# Patient Record
Sex: Male | Born: 1980 | Race: White | Hispanic: No | Marital: Single | Smoking: Current every day smoker
Health system: Southern US, Community
[De-identification: ages and names within clinical notes are randomized; demographics above are authoritative.]

## PROBLEM LIST (undated history)

## (undated) DIAGNOSIS — K219 Gastro-esophageal reflux disease without esophagitis: Secondary | ICD-10-CM

## (undated) DIAGNOSIS — B019 Varicella without complication: Secondary | ICD-10-CM

## (undated) HISTORY — DX: Varicella without complication: B01.9

## (undated) HISTORY — DX: Gastro-esophageal reflux disease without esophagitis: K21.9

## (undated) HISTORY — PX: HAND SURGERY: SHX662

---

## 2001-04-17 ENCOUNTER — Ambulatory Visit (HOSPITAL_BASED_OUTPATIENT_CLINIC_OR_DEPARTMENT_OTHER): Admission: RE | Admit: 2001-04-17 | Discharge: 2001-04-17 | Payer: Self-pay | Admitting: Orthopedic Surgery

## 2001-09-26 ENCOUNTER — Emergency Department (HOSPITAL_COMMUNITY): Admission: EM | Admit: 2001-09-26 | Discharge: 2001-09-26 | Payer: Self-pay

## 2001-10-07 ENCOUNTER — Emergency Department (HOSPITAL_COMMUNITY): Admission: EM | Admit: 2001-10-07 | Discharge: 2001-10-07 | Payer: Self-pay | Admitting: Emergency Medicine

## 2002-10-08 ENCOUNTER — Encounter: Payer: Self-pay | Admitting: Family Medicine

## 2002-10-08 ENCOUNTER — Encounter: Admission: RE | Admit: 2002-10-08 | Discharge: 2002-10-08 | Payer: Self-pay | Admitting: Family Medicine

## 2004-02-01 ENCOUNTER — Emergency Department (HOSPITAL_COMMUNITY): Admission: EM | Admit: 2004-02-01 | Discharge: 2004-02-01 | Payer: Self-pay | Admitting: Emergency Medicine

## 2005-03-27 ENCOUNTER — Emergency Department (HOSPITAL_COMMUNITY): Admission: EM | Admit: 2005-03-27 | Discharge: 2005-03-27 | Payer: Self-pay | Admitting: Emergency Medicine

## 2005-03-27 ENCOUNTER — Ambulatory Visit (HOSPITAL_COMMUNITY): Payer: Self-pay | Admitting: Psychiatry

## 2005-03-28 ENCOUNTER — Inpatient Hospital Stay (HOSPITAL_COMMUNITY): Admission: RE | Admit: 2005-03-28 | Discharge: 2005-03-29 | Payer: Self-pay | Admitting: Psychiatry

## 2005-03-28 ENCOUNTER — Ambulatory Visit: Payer: Self-pay | Admitting: Psychiatry

## 2005-04-10 ENCOUNTER — Ambulatory Visit (HOSPITAL_COMMUNITY): Payer: Self-pay | Admitting: Psychiatry

## 2005-05-17 ENCOUNTER — Ambulatory Visit: Payer: Self-pay | Admitting: Psychiatry

## 2005-05-17 ENCOUNTER — Ambulatory Visit (HOSPITAL_COMMUNITY): Payer: Self-pay | Admitting: Psychiatry

## 2005-06-26 ENCOUNTER — Ambulatory Visit (HOSPITAL_COMMUNITY): Payer: Self-pay | Admitting: Psychiatry

## 2005-07-03 ENCOUNTER — Ambulatory Visit (HOSPITAL_COMMUNITY): Payer: Self-pay | Admitting: Psychiatry

## 2005-07-22 IMAGING — CR DG FINGER MIDDLE 2+V*R*
1 series · 1 of 1 positions shown · non-contrast
Comparison: none

CLINICAL DATA: Smashed right middle finger in door.  Pain and swelling.
 RIGHT MIDDLE FINGER, THREE VIEWS 
 Soft tissue swelling is seen overlying the distal phalanx.  There is a nondisplaced fracture from the radial aspect of the tuft of the distal phalanx.  No other fracture is seen.  Alignment of the bones is normal. 
 IMPRESSION
 Nondisplaced fracture from the radial aspect of the tuft of the distal phalanx.

[view not recorded]
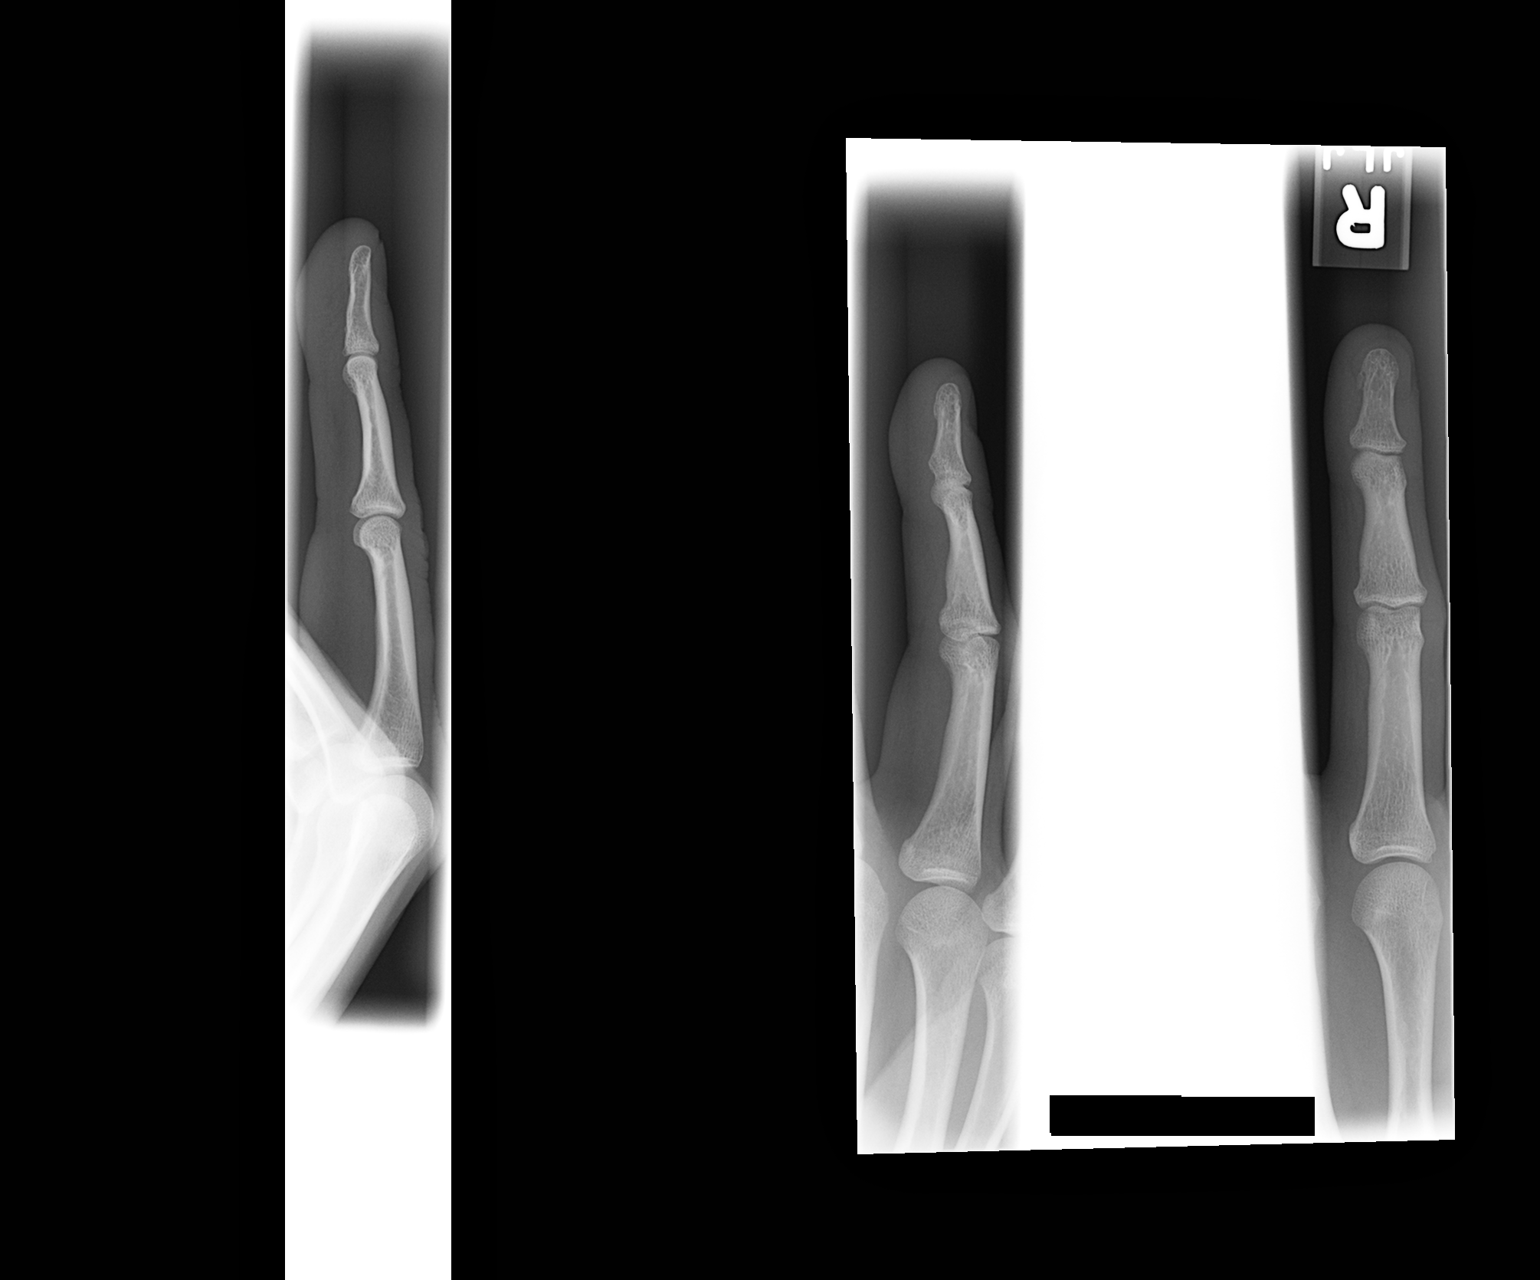

[1 of 1 positions shown; findings below may reference images not displayed]

## 2005-08-07 ENCOUNTER — Ambulatory Visit (HOSPITAL_COMMUNITY): Payer: Self-pay | Admitting: Psychiatry

## 2005-09-11 ENCOUNTER — Ambulatory Visit (HOSPITAL_COMMUNITY): Payer: Self-pay | Admitting: Psychiatry

## 2005-09-23 ENCOUNTER — Emergency Department (HOSPITAL_COMMUNITY): Admission: EM | Admit: 2005-09-23 | Discharge: 2005-09-23 | Payer: Self-pay | Admitting: Emergency Medicine

## 2005-09-24 ENCOUNTER — Emergency Department (HOSPITAL_COMMUNITY): Admission: EM | Admit: 2005-09-24 | Discharge: 2005-09-24 | Payer: Self-pay | Admitting: Emergency Medicine

## 2014-08-28 ENCOUNTER — Encounter: Payer: Self-pay | Admitting: Podiatrist

## 2014-08-28 ENCOUNTER — Ambulatory Visit (INDEPENDENT_AMBULATORY_CARE_PROVIDER_SITE_OTHER): Payer: Managed Care, Other (non HMO) | Admitting: Podiatrist

## 2014-08-28 VITALS — BP 124/85 | HR 66 | Resp 16 | Ht 72.0 in | Wt 175.0 lb

## 2014-08-28 DIAGNOSIS — L03039 Cellulitis of unspecified toe: Secondary | ICD-10-CM

## 2014-08-28 MED ORDER — CEPHALEXIN 500 MG PO CAPS: 500.0000 mg | ORAL_CAPSULE | Freq: Three times a day (TID) | ORAL | Status: DC

## 2014-08-28 NOTE — Patient Instructions (Signed)

## 2014-08-28 NOTE — Progress Notes (Signed)
   Subjective:    Patient ID: Tommy Ortega, male    DOB: 1981/04/27, 33 y.o.   MRN: [id number]  HPI Comments: Pt states he removed part of the left 1st toenail about 2 - 3 weeks ago, due to pain in the area.  Pt now has redness, swelling and serosanguinous fluid from the left 1st toenail area.  Pt has been treating with epsom salt soaks, H2O2 and home surgery.      Review of Systems  All other systems reviewed and are negative.      Objective:   Physical Exam  Patient is awake, alert, and oriented x 3.  In no acute distress.  Vascular status is intact with palpable pedal pulses at 2/4 DP and PT bilateral and capillary refill time within normal limits. Neurological sensation is also intact bilaterally via Semmes Weinstein monofilament at 5/5 sites. Light touch, vibratory sensation, Achilles tendon reflex is intact. Dermatological exam reveals skin color, turger and texture as normal. No open lesions present.  Musculature intact with dorsiflexion, plantarflexion, inversion, eversion. Left hallux nail medial nail border and appears to be red, swollen, irritated ingrown. No active drainage or pus is seen. Pain with pressure is noted.    Assessment & Plan:  Paronychia left hallux medial nail border  Name: Recommended an incision and drainage and patient agreed. The toe was anesthetized and then prepped with Betadine and exsanguinated. The medial nail border was then excised and the area was cleansed well with hydrogen peroxide and Betadine solution. Antibiotic ointment and a dressing was applied. The patient was given instructions for aftercare and a prescription for Keflex antibiotics was written. He will be seen back for followup as necessary

## 2014-12-14 ENCOUNTER — Ambulatory Visit (INDEPENDENT_AMBULATORY_CARE_PROVIDER_SITE_OTHER): Payer: Managed Care, Other (non HMO)

## 2014-12-15 ENCOUNTER — Encounter: Payer: Self-pay | Admitting: Emergency Medicine

## 2014-12-15 ENCOUNTER — Ambulatory Visit (INDEPENDENT_AMBULATORY_CARE_PROVIDER_SITE_OTHER): Payer: Managed Care, Other (non HMO) | Admitting: Emergency Medicine

## 2014-12-15 VITALS — BP 119/75 | HR 85 | Temp 98.2°F | Resp 16 | Ht 70.5 in | Wt 175.0 lb

## 2014-12-15 DIAGNOSIS — F1921 Other psychoactive substance dependence, in remission: Secondary | ICD-10-CM

## 2014-12-15 MED ORDER — CLONIDINE HCL 0.1 MG PO TABS: 0.1000 mg | ORAL_TABLET | Freq: Three times a day (TID) | ORAL | Status: DC

## 2014-12-15 MED ORDER — BACLOFEN 10 MG PO TABS: ORAL_TABLET | ORAL | Status: DC

## 2014-12-15 NOTE — Progress Notes (Signed)
Subjective:    Patient ID: Tommy Ortega, male    DOB: 07-23-1981, 33 y.o.   MRN: [id number] This chart was scribed for Lesle ChrisSteven Lenette Rau, MD by Littie Deedsichard Sun, Medical Scribe. This patient was seen in room 21 and the patient's care was started at 11:59 AM.   HPI HPI Comments: Tommy Ortega is a 33 y.o. male who presents to the [facility] complaining of insomnia due to withdrawal. He also reports having a sore throat.  Patient reports that he has been using substances including alcohol, marijuana and cocaine since the age of [age]. He states he did not have any trouble with substances until he started on oxycodone in his 5020s. He soon switched to taking heroin because oxycodone was hard to obtain. His heroin use was heavy, but he states he also snorted it and never injected it. Around 2003-2004, patient [facility] to [facility]. He was given medications for detox at that time, which was the only time he was put on medications for detox. Patient states that [facility] did not help and he was also kicked out because of drug use during the program. He also tried a methadone clinic, but that was not very helpful. Patient started smoking crack cocaine because he had heard that it was a way to get off heroin; however, he went on a 4 month crack binge after he started it. Patient started a program with [facility] [place] in 2007 and graduated in 2008. He was clean after that time until 2013 when some family issues occurred. His grandmother was hospitalized for DM complications, and his grandpa developed a MI at the time while trying to help the grandmother. His grandmother ended up living, but his grandfather passed away. Patient then became addicted to narcotics again and took some of his grandmother's medication. He has been to the [facility] recently to try to detox, but he has had some difficulty with the practice. The patient  has been seen by several different doctors who have been telling him different things. He last went 2 weeks ago, but he could not get a prescription for Suboxone due to paperwork issues not due to the fault of the patient. His last dose of Suboxone was half of a 8mg  on 12/14 and has been going through withdrawal symptoms since. These symptoms include insomnia, palpitations at night, chills, subjective fever, loss of appetite, diarrhea, myalgias especially in his arms and flu symptoms.  Patient has a 76109-year-old boy and is not together with the mother. He is not currently going to [facility]. He has been taking multivitamins and ginseng to boost his energy levels.   Review of Systems  Constitutional: Positive for fever, chills and appetite change.  HENT: Positive for sore throat.   Cardiovascular: Positive for palpitations.  Gastrointestinal: Positive for diarrhea.  Musculoskeletal: Positive for myalgias.  Psychiatric/Behavioral: Positive for sleep disturbance.       Objective:   Physical Exam CONSTITUTIONAL: Well developed/well nourished HEAD: Normocephalic/atraumatic EYES: EOM/PERRL ENMT: Mucous membranes moist NECK: supple no meningeal signs SPINE: entire spine nontender CV: S1/S2 noted, no murmurs/rubs/gallops noted LUNGS: Lungs are clear to auscultation bilaterally, no apparent distress ABDOMEN: soft, nontender, no rebound or guarding GU: no cva tenderness NEURO: Pt is awake/alert, moves all extremitiesx4 EXTREMITIES: pulses normal, full ROM SKIN: warm, color normal PSYCH: no abnormalities of mood noted        Assessment &  Plan:  Patient about 2 weeks out of stopping his Suboxone. He still has withdrawal symptoms primarily loose stools muscle cramping and a sensation of globus in his throat. Treat patient with Catapres 0.1  3 times a day along with baclofen 10 mg 1/2-1 at bedtime. He can continue Benadryl and ibuprofen. Urine drug screen in our office was negative. We'll recheck in 10  days.I personally performed the services described in this documentation, which was scribed in my presence. The recorded information has been reviewed and is accurate.

## 2014-12-24 ENCOUNTER — Encounter: Payer: Self-pay | Admitting: Emergency Medicine

## 2014-12-24 ENCOUNTER — Ambulatory Visit (INDEPENDENT_AMBULATORY_CARE_PROVIDER_SITE_OTHER): Payer: Managed Care, Other (non HMO)

## 2014-12-24 ENCOUNTER — Ambulatory Visit (INDEPENDENT_AMBULATORY_CARE_PROVIDER_SITE_OTHER): Payer: Managed Care, Other (non HMO) | Admitting: Emergency Medicine

## 2014-12-24 VITALS — BP 106/60 | HR 70 | Temp 98.0°F | Resp 16 | Ht 71.0 in | Wt 179.8 lb

## 2014-12-24 DIAGNOSIS — R0989 Other specified symptoms and signs involving the circulatory and respiratory systems: Secondary | ICD-10-CM

## 2014-12-24 DIAGNOSIS — F458 Other somatoform disorders: Secondary | ICD-10-CM

## 2014-12-24 DIAGNOSIS — R070 Pain in throat: Secondary | ICD-10-CM

## 2014-12-24 DIAGNOSIS — R252 Cramp and spasm: Secondary | ICD-10-CM

## 2014-12-24 LAB — TSH: TSH: 1.329 u[IU]/mL (ref 0.350–4.500)

## 2014-12-24 LAB — CBC WITH DIFFERENTIAL/PLATELET
Basophils Absolute: 0.1 10*3/uL (ref 0.0–0.1)
Basophils Relative: 1 % (ref 0–1)
Eosinophils Absolute: 0.3 10*3/uL (ref 0.0–0.7)
Eosinophils Relative: 3 % (ref 0–5)
HCT: 43.1 % (ref 39.0–52.0)
Hemoglobin: 14.7 g/dL (ref 13.0–17.0)
Lymphocytes Relative: 23 % (ref 12–46)
Lymphs Abs: 2.2 10*3/uL (ref 0.7–4.0)
MCH: 28.8 pg (ref 26.0–34.0)
MCHC: 34.1 g/dL (ref 30.0–36.0)
MCV: 84.3 fL (ref 78.0–100.0)
MPV: 11.2 fL (ref 8.6–12.4)
Monocytes Absolute: 0.7 10*3/uL (ref 0.1–1.0)
Monocytes Relative: 7 % (ref 3–12)
Neutro Abs: 6.2 10*3/uL (ref 1.7–7.7)
Neutrophils Relative %: 66 % (ref 43–77)
Platelets: 237 10*3/uL (ref 150–400)
RBC: 5.11 MIL/uL (ref 4.22–5.81)
RDW: 14 % (ref 11.5–15.5)
WBC: 9.4 10*3/uL (ref 4.0–10.5)

## 2014-12-24 LAB — T4: T4, Total: 6.7 ug/dL (ref 4.5–12.0)

## 2014-12-24 NOTE — Progress Notes (Addendum)
   Subjective:  This chart was scribed for Tommy GobbleSteven A Donis Kotowski, MD by Tommy Ortega, ED Scribe. The patient was seen in room 22. Patient's care was started at 9:59 AM.  Patient ID: Tommy Ortega, male    DOB: 05-Oct-1981, 34 y.o.   MRN: [id number]  HPI  HPI Comments: Tommy Ortega is a 34 y.o. male who presents to the [facility] for follow up with drug addition to Suboxone. He reports intermittent difficulty sleeping. He states that his prescribed Clonidine has helped with this problem, but not relieved it entirely. He states that he does not take it during the day because it reduces his energy at work. He is reporting a discomfort in his throat that feels like something is stuck there, and he states that he noticed a "white spot" that went away. He denies difficulty swallowing or eating food. He additionally reports left flank pain, and expresses concern of a kidney infection. He states that he has not smoked a cigarette in a long time. He states that he has used dip for 3-4 years, and he used a tobacco vape recently.    Review of Systems  Constitutional: Negative for fever and chills.  HENT: Negative for congestion, ear pain, rhinorrhea and sinus pressure.   Respiratory: Negative for chest tightness, shortness of breath and wheezing.   Cardiovascular: Negative for chest pain, palpitations and leg swelling.  Gastrointestinal: Negative for nausea, vomiting, abdominal pain, diarrhea, constipation and abdominal distention.  Genitourinary: Positive for flank pain. Negative for dysuria, hematuria and difficulty urinating.  Musculoskeletal: Negative for back pain, arthralgias and neck pain.  Skin: Negative for rash and wound.  Neurological: Negative for dizziness, weakness, numbness and headaches.  Psychiatric/Behavioral: Positive for sleep disturbance.    Objective:  Physical Exam  CONSTITUTIONAL: Well developed/well nourished HEAD: Normocephalic/atraumatic EYES:  EOMI/PERRL ENMT: Mucous membranes moist, normal digital exam NECK: supple no meningeal signs, normal thyroid SPINE/BACK:entire spine nontender CV: S1/S2 noted, no murmurs/rubs/gallops noted LUNGS: Lungs are clear to auscultation bilaterally, no apparent distress ABDOMEN: soft, nontender, no rebound or guarding, bowel sounds noted throughout abdomen GU:no cva tenderness NEURO: Pt is awake/alert/appropriate, moves all extremitiesx4.  No facial droop.   EXTREMITIES: pulses normal/equal, full ROM SKIN: warm, color normal PSYCH: no abnormalities of mood noted, alert and oriented to situation [facility] reading (PRIMARY) by  Dr. Cleta Albertsaub no acute disease on chest x-ray. Lateral neck please comment on the area posterior to the base of the tongue. no other definite abnormalities.   Assessment & Plan:  We'll ask for a reading from radiology regarding the globus sensation in his throat and neck. Patient seems to be doing well. We'll go ahead and CT the neck and get an opinion from ENT regarding the sensation he has of something in his throat.I personally performed the services described in this documentation, which was scribed in my presence. The recorded information has been reviewed and is accurate. The radiologist did not see any abnormalities on his x-rays and recommended a CT.

## 2014-12-31 ENCOUNTER — Other Ambulatory Visit: Payer: Self-pay

## 2016-06-13 IMAGING — CR DG NECK SOFT TISSUE
1 series · 1 of 1 positions shown · non-contrast
Comparison: None.

CLINICAL DATA: Sensation of something stuck in throat.

EXAM:
NECK SOFT TISSUES - 1+ VIEW

[lateral]
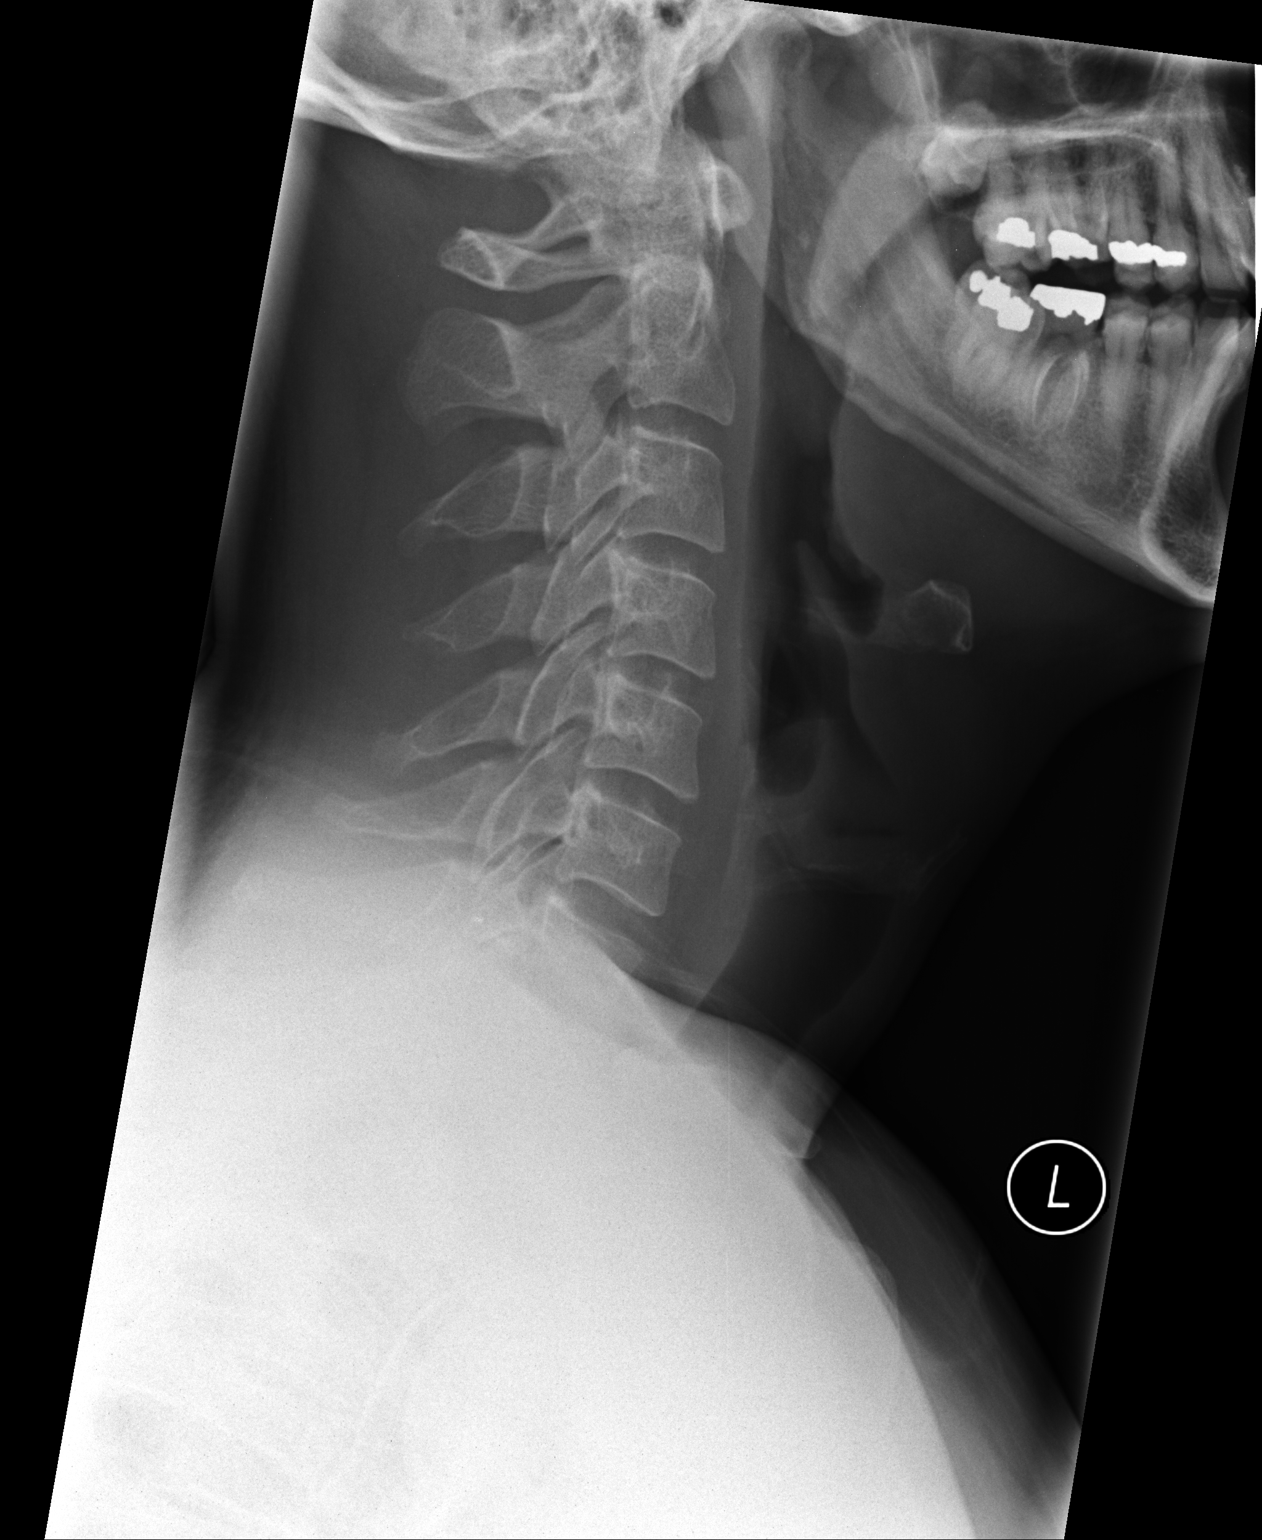

[1 of 1 positions shown; findings below may reference images not displayed]

FINDINGS: Soft tissue structures and bony structures are normal. Cervical
airway is widely patent. Epiglottis is normal. Oropharynx region
appears normal. Tongue base appears unremarkable.
IMPRESSION: Negative exam.  If symptoms persist CT of the neck can be obtained.

## 2019-01-10 ENCOUNTER — Encounter: Payer: Self-pay | Admitting: Family Medicine

## 2019-01-10 ENCOUNTER — Ambulatory Visit (INDEPENDENT_AMBULATORY_CARE_PROVIDER_SITE_OTHER): Payer: 59 | Admitting: Family Medicine

## 2019-01-10 VITALS — BP 142/70 | Ht 71.0 in | Wt 171.5 lb

## 2019-01-10 DIAGNOSIS — F1911 Other psychoactive substance abuse, in remission: Secondary | ICD-10-CM | POA: Diagnosis not present

## 2019-01-10 DIAGNOSIS — K589 Irritable bowel syndrome without diarrhea: Secondary | ICD-10-CM

## 2019-01-10 DIAGNOSIS — J40 Bronchitis, not specified as acute or chronic: Secondary | ICD-10-CM | POA: Diagnosis not present

## 2019-01-10 DIAGNOSIS — K089 Disorder of teeth and supporting structures, unspecified: Secondary | ICD-10-CM | POA: Diagnosis not present

## 2019-01-10 DIAGNOSIS — Z0000 Encounter for general adult medical examination without abnormal findings: Secondary | ICD-10-CM | POA: Diagnosis not present

## 2019-01-10 DIAGNOSIS — H6122 Impacted cerumen, left ear: Secondary | ICD-10-CM | POA: Insufficient documentation

## 2019-01-10 LAB — CBC
HEMATOCRIT: 44.8 % (ref 39.0–52.0)
Hemoglobin: 15.1 g/dL (ref 13.0–17.0)
MCHC: 33.7 g/dL (ref 30.0–36.0)
MCV: 88.3 fl (ref 78.0–100.0)
Platelets: 222 10*3/uL (ref 150.0–400.0)
RBC: 5.07 Mil/uL (ref 4.22–5.81)
RDW: 13 % (ref 11.5–15.5)
WBC: 8.9 10*3/uL (ref 4.0–10.5)

## 2019-01-10 LAB — COMPREHENSIVE METABOLIC PANEL
ALT: 26 U/L (ref 0–53)
AST: 22 U/L (ref 0–37)
Albumin: 4.8 g/dL (ref 3.5–5.2)
Alkaline Phosphatase: 63 U/L (ref 39–117)
BILIRUBIN TOTAL: 0.6 mg/dL (ref 0.2–1.2)
BUN: 11 mg/dL (ref 6–23)
CALCIUM: 9.9 mg/dL (ref 8.4–10.5)
CHLORIDE: 103 meq/L (ref 96–112)
CO2: 28 mEq/L (ref 19–32)
CREATININE: 1.05 mg/dL (ref 0.40–1.50)
GFR: 78.82 mL/min (ref >60.00)
Glucose, Bld: 84 mg/dL (ref 70–99)
Potassium: 4.1 mEq/L (ref 3.5–5.1)
Sodium: 138 mEq/L (ref 135–145)
Total Protein: 7.7 g/dL (ref 6.0–8.3)

## 2019-01-10 LAB — URINALYSIS, ROUTINE W REFLEX MICROSCOPIC
Bilirubin Urine: NEGATIVE
Hgb urine dipstick: NEGATIVE
Ketones, ur: NEGATIVE
Leukocytes, UA: NEGATIVE
Nitrite: NEGATIVE
Specific Gravity, Urine: <=1.005 — AB (ref 1.000–1.030)
Total Protein, Urine: NEGATIVE
Urine Glucose: NEGATIVE
Urobilinogen, UA: 0.2 (ref 0.0–1.0)
pH: 6 (ref 5.0–8.0)

## 2019-01-10 LAB — LIPID PANEL
CHOLESTEROL: 190 mg/dL (ref 0–200)
HDL: 42.3 mg/dL (ref >39.00)
LDL Cholesterol: 118 mg/dL — ABNORMAL HIGH (ref 0–99)
NonHDL: 147.53
TRIGLYCERIDES: 149 mg/dL (ref 0.0–149.0)
Total CHOL/HDL Ratio: 4
VLDL: 29.8 mg/dL (ref 0.0–40.0)

## 2019-01-10 LAB — TSH: TSH: 0.69 u[IU]/mL (ref 0.35–4.50)

## 2019-01-10 MED ORDER — AMOXICILLIN 500 MG PO CAPS: 500.0000 mg | ORAL_CAPSULE | Freq: Three times a day (TID) | ORAL | 0 refills | Status: DC

## 2019-01-10 NOTE — Patient Instructions (Signed)
Earwax Buildup, Adult The ears produce a substance called earwax that helps keep bacteria out of the ear and protects the skin in the ear canal. Occasionally, earwax can build up in the ear and cause discomfort or hearing loss. What increases the risk? This condition is more likely to develop in people who:  Are male.  Are elderly.  Naturally produce more earwax.  Clean their ears often with cotton swabs.  Use earplugs often.  Use in-ear headphones often.  Wear hearing aids.  Have narrow ear canals.  Have earwax that is overly thick or sticky.  Have eczema.  Are dehydrated.  Have excess hair in the ear canal. What are the signs or symptoms? Symptoms of this condition include:  Reduced or muffled hearing.  A feeling of fullness in the ear or feeling that the ear is plugged.  Fluid coming from the ear.  Ear pain.  Ear itch.  Ringing in the ear.  Coughing.  An obvious piece of earwax that can be seen inside the ear canal. How is this diagnosed? This condition may be diagnosed based on:  Your symptoms.  Your medical history.  An ear exam. During the exam, your health care provider will look into your ear with an instrument called an otoscope. You may have tests, including a hearing test. How is this treated? This condition may be treated by:  Using ear drops to soften the earwax.  Having the earwax removed by a health care provider. The health care provider may: ? Flush the ear with water. ? Use an instrument that has a loop on the end (curette). ? Use a suction device.  Surgery to remove the wax buildup. This may be done in severe cases. Follow these instructions at home:   Take over-the-counter and prescription medicines only as told by your health care provider.  Do not put any objects, including cotton swabs, into your ear. You can clean the opening of your ear canal with a washcloth or facial tissue.  Follow instructions from your health care  provider about cleaning your ears. Do not over-clean your ears.  Drink enough fluid to keep your urine clear or pale yellow. This will help to thin the earwax.  Keep all follow-up visits as told by your health care provider. If earwax builds up in your ears often or if you use hearing aids, consider seeing your health care provider for routine, preventive ear cleanings. Ask your health care provider how often you should schedule your cleanings.  If you have hearing aids, clean them according to instructions from the manufacturer and your health care provider. Contact a health care provider if:  You have ear pain.  You develop a fever.  You have blood, pus, or other fluid coming from your ear.  You have hearing loss.  You have ringing in your ears that does not go away.  Your symptoms do not improve with treatment.  You feel like the room is spinning (vertigo). Summary  Earwax can build up in the ear and cause discomfort or hearing loss.  The most common symptoms of this condition include reduced or muffled hearing and a feeling of fullness in the ear or feeling that the ear is plugged.  This condition may be diagnosed based on your symptoms, your medical history, and an ear exam.  This condition may be treated by using ear drops to soften the earwax or by having the earwax removed by a health care provider.  Do not put any   objects, including cotton swabs, into your ear. You can clean the opening of your ear canal with a washcloth or facial tissue. This information is not intended to replace advice given to you by your health care provider. Make sure you discuss any questions you have with your health care provider. Document Released: 01/11/2005 Document Revised: 11/15/2017 Document Reviewed: 02/14/2017 Elsevier Interactive Patient Education  2019 [employer]  Chemical Dependency Chemical dependency is an addiction to drugs or alcohol. People with this addiction repeatedly  seek out and use drugs or alcohol despite negative consequences to the health and safety of themselves and others. Addiction changes the way the brain works. Because of these changes, addiction is a chronic condition. The medical term for addiction or chemical dependency is substance use disorder. The disorder can be mild, moderate, or severe. People can be dependent on a range of substances. These include alcohol, prescription medicines, and illegal or street drugs, such as marijuana, heroin, and cocaine. What are the causes? This condition is caused by the effect that the abused substance has on the brain. What increases the risk? The following factors may make you more likely to develop this condition:  Havinga family history of chemical dependency.  Having mental health issues, such as depression, anxiety, or bipolar disorder.  Living in an environment where drugs and alcohol are easily available.  Using drugs or alcohol at a young age.  Having friends who use drugs or alcohol.  Having poor social skills.  Tending to be aggressive or impulsive. What are the signs or symptoms? Symptoms may vary depending on the substance that you are addicted to. Symptoms may include the following: Physical Symptoms  The inability to limit the use of drugs or alcohol.  Having nausea, sweating, shakiness, and anxiety when you are not using alcohol or drugs.  Needing a greater amount of drugs or alcohol to get the same effect (developing tolerance).  A change in: ? Sleeping habits. ? Eating or appetite. ? Appearance or how you care for yourself. Emotional Symptoms  Angry outbursts.  Periods of sadness and tearfulness.  Isolation. Relationship Problems  Loved ones suggesting that you have a problem.  Increased fights.  Forgetting commitments.  Having affairs or one-night stands. Problems Related to Irresponsibility  Legal problems.  Irresponsibility with money.  Missing  work.  Poor decision making. How is this diagnosed? This condition may be diagnosed based on your symptoms, your medical history, and a physical exam. You may also have blood tests and urine tests. How is this treated? Treatment for this condition depends on the substance that you are addicted to and whether your dependency is mild, moderate, or severe. Treatment options may include:  Stopping substance use safely. This may require taking medicines and being closely observed for several days.  Taking part in group and individual counseling with mental health providers who help people with chemical dependency.  Staying at a residential treatment center for several days or weeks.  Attending daily counseling sessions at a treatment center.  Taking medicine as told by your health care provider to: ? Ease symptoms and prevent complications during withdrawal. ? Treat other mental health issues, such as depression or anxiety. ? Block cravings by causing the same effects as the substance. ? Block the effects of the substance or replace good sensations with unpleasant ones.  Going to a support group to share your experience with others who are going through the same thing. These groups are an important part of long-term recovery  for many people. They include 12-step programs like Alcoholics Anonymous and Narcotics Anonymous.  Recovery can be a long process. Many people who undergo treatment start using the substance again after stopping. This is called a relapse. If you have a relapse, it does not mean that treatment will not work. Follow these instructions at home:  Avoid temptations or triggers that you associate with your use of the substance.  Learn and practice techniques for managing stress.  Have a plan for vulnerable moments.  Get phone numbers of those who are willing to help and who are committed to your recovery.  Know when and where the meetings that you have chosen will  occur.  Take over-the-counter and prescription medicines only as told by your health care provider.  Keep all follow-up visits as told by your health care provider. This is important. Contact a health care provider if:  You cannot take your medicines as told.  Your symptoms get worse.  You have trouble resisting the urge to use drugs or alcohol.  You are in pain, shaking, sweating, or feeling generally unwell.  You are losing weight without trying to. Get help right away if:  You lose consciousness.  Your breathing is slow.  Your pulse is slow or jumpy.  You have serious thoughts about hurting yourself or someone else.  You have a relapse. This information is not intended to replace advice given to you by your health care provider. Make sure you discuss any questions you have with your health care provider. Document Released: 11/28/2001 Document Revised: 01/10/2016 Document Reviewed: 08/11/2015 Elsevier Interactive Patient Education  2019 [employer].  Health Maintenance, Male A healthy lifestyle and preventive care is important for your health and wellness. Ask your health care provider about what schedule of regular examinations is right for you. What should I know about weight and diet? Eat a Healthy Diet  Eat plenty of vegetables, fruits, whole grains, low-fat dairy products, and lean protein.  Do not eat a lot of foods high in solid fats, added sugars, or salt.  Maintain a Healthy Weight Regular exercise can help you achieve or maintain a healthy weight. You should:  Do at least 150 minutes of exercise each week. The exercise should increase your heart rate and make you sweat (moderate-intensity exercise).  Do strength-training exercises at least twice a week. Watch Your Levels of Cholesterol and Blood Lipids  Have your blood tested for lipids and cholesterol every 5 years starting at 39 years of age. If you are at high risk for heart disease, you should start  having your blood tested when you are 39 years old. You may need to have your cholesterol levels checked more often if: ? Your lipid or cholesterol levels are high. ? You are older than 39 years of age. ? You are at high risk for heart disease. What should I know about cancer screening? Many types of cancers can be detected early and may often be prevented. Lung Cancer  You should be screened every year for lung cancer if: ? You are a current smoker who has smoked for at least 30 years. ? You are a former smoker who has quit within the past 15 years.  Talk to your health care provider about your screening options, when you should start screening, and how often you should be screened. Colorectal Cancer  Routine colorectal cancer screening usually begins at 39 years of age and should be repeated every 5-10 years until you are 39 years  old. Bonita QuinYou may need to be screened more often if early forms of precancerous polyps or small growths are found. Your health care provider may recommend screening at an earlier age if you have risk factors for colon cancer.  Your health care provider may recommend using home test kits to check for hidden blood in the stool.  A small camera at the end of a tube can be used to examine your colon (sigmoidoscopy or colonoscopy). This checks for the earliest forms of colorectal cancer. Prostate and Testicular Cancer  Depending on your age and overall health, your health care provider may do certain tests to screen for prostate and testicular cancer.  Talk to your health care provider about any symptoms or concerns you have about testicular or prostate cancer. Skin Cancer  Check your skin from head to toe regularly.  Tell your health care provider about any new moles or changes in moles, especially if: ? There is a change in a mole's size, shape, or color. ? You have a mole that is larger than a pencil eraser.  Always use sunscreen. Apply sunscreen liberally and  repeat throughout the day.  Protect yourself by wearing long sleeves, pants, a wide-brimmed hat, and sunglasses when outside. What should I know about heart disease, diabetes, and high blood pressure?  If you are 5218-39 years of age, have your blood pressure checked every 3-5 years. If you are 39 years of age or older, have your blood pressure checked every year. You should have your blood pressure measured twice-once when you are at a hospital or clinic, and once when you are not at a hospital or clinic. Record the average of the two measurements. To check your blood pressure when you are not at a hospital or clinic, you can use: ? An automated blood pressure machine at a pharmacy. ? A home blood pressure monitor.  Talk to your health care provider about your target blood pressure.  If you are between 5645-39 years old, ask your health care provider if you should take aspirin to prevent heart disease.  Have regular diabetes screenings by checking your fasting blood sugar level. ? If you are at a normal weight and have a low risk for diabetes, have this test once every three years after the age of 39. ? If you are overweight and have a high risk for diabetes, consider being tested at a younger age or more often.  A one-time screening for abdominal aortic aneurysm (AAA) by ultrasound is recommended for men aged 65-75 years who are current or former smokers. What should I know about preventing infection? Hepatitis B If you have a higher risk for hepatitis B, you should be screened for this virus. Talk with your health care provider to find out if you are at risk for hepatitis B infection. Hepatitis C Blood testing is recommended for:  Everyone born from [id number] through 1966.  Anyone with known risk factors for hepatitis C. Sexually Transmitted Diseases (STDs)  You should be screened each year for STDs including gonorrhea and chlamydia if: ? You are sexually active and are younger than 39 years  of age. ? You are older than 39 years of age and your health care provider tells you that you are at risk for this type of infection. ? Your sexual activity has changed since you were last screened and you are at an increased risk for chlamydia or gonorrhea. Ask your health care provider if you are at risk.  Talk with  your health care provider about whether you are at high risk of being infected with HIV. Your health care provider may recommend a prescription medicine to help prevent HIV infection. What else can I do?  Schedule regular health, dental, and eye exams.  Stay current with your vaccines (immunizations).  Do not use any tobacco products, such as cigarettes, chewing tobacco, and e-cigarettes. If you need help quitting, ask your health care provider.  Limit alcohol intake to no more than 2 drinks per day. One drink equals 12 ounces of beer, 5 ounces of wine, or 1 ounces of hard liquor.  Do not use street drugs.  Do not share needles.  Ask your health care provider for help if you need support or information about quitting drugs.  Tell your health care provider if you often feel depressed.  Tell your health care provider if you have ever been abused or do not feel safe at home. This information is not intended to replace advice given to you by your health care provider. Make sure you discuss any questions you have with your health care provider. Document Released: 06/01/2008 Document Revised: 08/02/2016 Document Reviewed: 09/07/2015 Elsevier Interactive Patient Education  2019 [employer].  Substance Use Disorder Substance use disorder occurs when a person's repeated use of drugs or alcohol interferes with his or her ability to be productive. This disorder can cause problems with mental and physical health. It can affect your ability to have healthy relationships, and it can keep you from being able to meet your responsibilities at work, home, or school. It can also lead to  addiction, which is a condition in which the person cannot stop using the substance consistently for a period of time. The most commonly abused substances include:  Alcohol.  Tobacco.  Marijuana.  Stimulants, such as cocaine and methamphetamine.  Hallucinogens, such as LSD and PCP.  Opioids, such as some prescription pain medicines and heroin. What are the causes? This condition may develop due to many complex social, psychological, or physical reasons, such as:  Stress.  Abuse.  Peer pressure.  Anxiety or depression. What increases the risk? This condition is more likely to develop in people who:  Use substances to cope with stress.  Have been abused.  Have a mental health disorder, such as depression.  Have a family history of substance use disorder. What are the signs or symptoms? Symptoms of this condition include:  Using the substance for longer periods of time or at a higher dosage than what is normal or intended.  Having a lasting desire to use the substance.  Being unable to slow down or stop your use of the substance.  Spending an abnormal amount of time getting the substance, using the substance, or recovering from using the substance.  Craving the substance.  Using the substance in a way that interferes with work, school, social activities, and personal relationships.  Using the substance even after having negative consequences, such as: ? Health problems. ? Legal or financial troubles. ? Job loss. ? Broken relationships.  Needing more and more of the substance to get the same effect (developing tolerance).  Experiencing unpleasant symptoms if you do not use the substance (withdrawal).  Using the substance to avoid withdrawal. How is this diagnosed? This condition may be diagnosed based on:  A physical exam.  Your history of substance use.  Your symptoms. This includes: ? How substance use affects your life. ? Changes in personality,  behaviors, and mood. ? Having at  least two symptoms of substance use disorder within a 70-month period. ? Health issues related to substance use, such as liver damage, shortness of breath, fatigue, cough, or heart problems.  Blood or urine tests to screen for alcohol and drugs. How is this treated? This condition may be treated by:  Stopping substance use safely. This may require taking medicines and being closely observed for several days.  Taking part in group and individual counseling from mental health providers who help people with substance use disorder.  Staying at a live-in (residential) treatment center for several days or weeks.  Attending daily counseling sessions at a treatment center.  Taking medicine as told by your health care provider: ? To ease symptoms and prevent complications during withdrawal. ? To treat other mental health issues, such as depression or anxiety. ? To block cravings by causing the same effects as the substance. ? To block the effects of the substance or replace good sensations with unpleasant ones.  Going to a support group to share your experience with others who are going through the same thing. Recovery can be a long process. Many people who undergo treatment start using the substance again after stopping (relapse). If you relapse, that does not mean that treatment will not work. Follow these instructions at home:   Take over-the-counter and prescription medicines only as told by your health care provider.  Do not use any drugs or alcohol.  Attend support groups as needed. These groups, including 12-step programs like Alcoholics Anonymous and Narcotics Anonymous, are an important part of long-term recovery for many people.  Keep all follow-up visits as told by your health care providers. This is important. This includes continuing to work with therapists and support groups. Contact a health care provider if:  You cannot take your medicines  as told.  Your symptoms get worse.  You have trouble resisting the urge to use drugs or alcohol. Get help right away if you:  Relapse.  Think that you may have taken too much of a drug. The hotline of the [facility] is [phone].  Have signs of an overdose. Symptoms include: ? Chest pain. ? Confusion. ? Sleepiness or difficulty staying awake. ? Slowed breathing. ? Nausea or vomiting. ? A seizure.  Have serious thoughts about hurting yourself or someone else. Drug overdose is an emergency. Do not wait to see if the symptoms will go away. Get medical help right away. Call your local emergency services (911 in the [place] Do not drive yourself to the hospital. If you ever feel like you may hurt yourself or others, or have thoughts about taking your own life, get help right away. You can go to your nearest emergency department or call:  Your local emergency services (911 in the [place]  A suicide crisis helpline, such as the National Suicide Prevention Lifeline at 260-401-3540. This is open 24 hours a day. Summary  Substance use disorder occurs when a person's repeated use of drugs or alcohol interferes with his or her ability to be productive. It can affect your ability to have healthy relationships, keep you from being able to meet your responsibilities at work, home, or school, and lead to addiction.  Taking part in group and individual counseling from mental health providers is one possible treatment for people with substance use disorder.  Recovery can be a long process. Many people who undergo treatment start using the substance again after stopping (relapse). A relapse does not mean that treatment will  not work.  Attend support groups as needed, such as Alcoholics Anonymous and Narcotics Anonymous. These groups are an important part of long-term recovery for many people. This information is not intended to replace advice given to you by your health care  provider. Make sure you discuss any questions you have with your health care provider. Document Released: 07/26/2005 Document Revised: 01/15/2018 Document Reviewed: 01/15/2018 Elsevier Interactive Patient Education  2019 [employer] Inc[employer]  Upper Respiratory Infection, Adult An upper respiratory infection (URI) is a common viral infection of the nose, throat, and upper air passages that lead to the lungs. The most common type of URI is the common cold. URIs usually get better on their own, without medical treatment. What are the causes? A URI is caused by a virus. You may catch a virus by:  Breathing in droplets from an infected person's cough or sneeze.  Touching something that has been exposed to the virus (contaminated) and then touching your mouth, nose, or eyes. What increases the risk? You are more likely to get a URI if:  You are very young or very old.  It is autumn or winter.  You have close contact with others, such as at a daycare, school, or health care facility.  You smoke.  You have long-term (chronic) heart or lung disease.  You have a weakened disease-fighting (immune) system.  You have nasal allergies or asthma.  You are experiencing a lot of stress.  You work in an area that has poor air circulation.  You have poor nutrition. What are the signs or symptoms? A URI usually involves some of the following symptoms:  Runny or stuffy (congested) nose.  Sneezing.  Cough.  Sore throat.  Headache.  Fatigue.  Fever.  Loss of appetite.  Pain in your forehead, behind your eyes, and over your cheekbones (sinus pain).  Muscle aches.  Redness or irritation of the eyes.  Pressure in the ears or face. How is this diagnosed? This condition may be diagnosed based on your medical history and symptoms, and a physical exam. Your health care provider may use a cotton swab to take a mucus sample from your nose (nasal swab). This sample can be tested to determine  what virus is causing the illness. How is this treated? URIs usually get better on their own within 7-10 days. You can take steps at home to relieve your symptoms. Medicines cannot cure URIs, but your health care provider may recommend certain medicines to help relieve symptoms, such as:  Over-the-counter cold medicines.  Cough suppressants. Coughing is a type of defense against infection that helps to clear the respiratory system, so take these medicines only as recommended by your health care provider.  Fever-reducing medicines. Follow these instructions at home: Activity  Rest as needed.  If you have a fever, stay home from work or school until your fever is gone or until your health care provider says you are no longer contagious. Your health care provider may have you wear a face mask to prevent your infection from spreading. Relieving symptoms  Gargle with a salt-water mixture 3-4 times a day or as needed. To make a salt-water mixture, completely dissolve -1 tsp of salt in 1 cup of warm water.  Use a cool-mist humidifier to add moisture to the air. This can help you breathe more easily. Eating and drinking   Drink enough fluid to keep your urine pale yellow.  Eat soups and other clear broths. General instructions   Take over-the-counter and  prescription medicines only as told by your health care provider. These include cold medicines, fever reducers, and cough suppressants.  Do not use any products that contain nicotine or tobacco, such as cigarettes and e-cigarettes. If you need help quitting, ask your health care provider.  Stay away from secondhand smoke.  Stay up to date on all immunizations, including the yearly (annual) flu vaccine.  Keep all follow-up visits as told by your health care provider. This is important. How to prevent the spread of infection to others   URIs can be passed from person to person (are contagious). To prevent the infection from  spreading: ? Wash your hands often with soap and water. If soap and water are not available, use hand sanitizer. ? Avoid touching your mouth, face, eyes, or nose. ? Cough or sneeze into a tissue or your sleeve or elbow instead of into your hand or into the air. Contact a health care provider if:  You are getting worse instead of better.  You have a fever or chills.  Your mucus is brown or red.  You have yellow or brown discharge coming from your nose.  You have pain in your face, especially when you bend forward.  You have swollen neck glands.  You have pain while swallowing.  You have white areas in the back of your throat. Get help right away if:  You have shortness of breath that gets worse.  You have severe or persistent: ? Headache. ? Ear pain. ? Sinus pain. ? Chest pain.  You have chronic lung disease along with any of the following: ? Wheezing. ? Prolonged cough. ? Coughing up blood. ? A change in your usual mucus.  You have a stiff neck.  You have changes in your: ? Vision. ? Hearing. ? Thinking. ? Mood. Summary  An upper respiratory infection (URI) is a common infection of the nose, throat, and upper air passages that lead to the lungs.  A URI is caused by a virus.  URIs usually get better on their own within 7-10 days.  Medicines cannot cure URIs, but your health care provider may recommend certain medicines to help relieve symptoms. This information is not intended to replace advice given to you by your health care provider. Make sure you discuss any questions you have with your health care provider. Document Released: 05/30/2001 Document Revised: 07/20/2017 Document Reviewed: 07/20/2017 Elsevier Interactive Patient Education  2019 [employer]  Irritable Bowel Syndrome, Adult  Irritable bowel syndrome (IBS) is a group of symptoms that affects the organs responsible for digestion (gastrointestinal or GI tract). IBS is not one specific  disease. To regulate how the GI tract works, the body sends signals back and forth between the intestines and the brain. If you have IBS, there may be a problem with these signals. As a result, the GI tract does not function normally. The intestines may become more sensitive and overreact to certain things. This may be especially true when you eat certain foods or when you are under stress. There are four types of IBS. These may be determined based on the consistency of your stool (feces):  IBS with diarrhea.  IBS with constipation.  Mixed IBS.  Unsubtyped IBS. It is important to know which type of IBS you have. Certain treatments are more likely to be helpful for certain types of IBS. What are the causes? The exact cause of IBS is not known. What increases the risk? You may have a higher risk for IBS if  you:  Are male.  Are younger than 6440.  Have a family history of IBS.  Have a mental health condition, such as depression, anxiety, or post-traumatic stress disorder.  Have had a bacterial infection of your GI tract. What are the signs or symptoms? Symptoms of IBS vary from person to person. The main symptom is abdominal pain or discomfort. Other symptoms usually include one or more of the following:  Diarrhea, constipation, or both.  Abdominal swelling or bloating.  Feeling full after eating a small or regular-sized meal.  Frequent gas.  Mucus in the stool.  A feeling of having more stool left after a bowel movement. Symptoms tend to come and go. They may be triggered by stress, mental health conditions, or certain foods. How is this diagnosed? This condition may be diagnosed based on a physical exam, your medical history, and your symptoms. You may have tests, such as:  Blood tests.  Stool test.  X-rays.  CT scan.  Colonoscopy. This is a procedure in which your GI tract is viewed with a long, thin, flexible tube. How is this treated? There is no cure for IBS,  but treatment can help relieve symptoms. Treatment depends on the type of IBS you have, and may include:  Changes to your diet, such as: ? Avoiding foods that cause symptoms. ? Drinking more water. ? Following a low-FODMAP (fermentable oligosaccharides, disaccharides, monosaccharides, and polyols) diet for up to 6 weeks, or as told by your health care provider. FODMAPs are sugars that are hard for some people to digest. ? Eating more fiber. ? Eating medium-sized meals at the same times every day.  Medicines. These may include: ? Fiber supplements, if you have constipation. ? Medicine to control diarrhea (antidiarrheal medicines). ? Medicine to help control muscle tightening (spasms) in your GI tract (antispasmodic medicines). ? Medicines to help with mental health conditions, such as antidepressants or tranquilizers.  Talk therapy or counseling.  Working with a diet and nutrition specialist (dietitian) to help create a food plan that is right for you.  Managing your stress. Follow these instructions at home: Eating and drinking  Eat a healthy diet.  Eat medium-sized meals at about the same time every day. Do not eat large meals.  Gradually eat more fiber-rich foods. These include whole grains, fruits, and vegetables. This may be especially helpful if you have IBS with constipation.  Eat a diet low in FODMAPs.  Drink enough fluid to keep your urine pale yellow.  Keep a journal of foods that seem to trigger symptoms.  Avoid foods and drinks that: ? Contain added sugar. ? Make your symptoms worse. Dairy products, caffeinated drinks, and carbonated drinks can make symptoms worse for some people. General instructions  Take over-the-counter and prescription medicines and supplements only as told by your health care provider.  Get enough exercise. Do at least 150 minutes of moderate-intensity exercise each week.  Manage your stress. Getting enough sleep and exercise can help you  manage stress.  Keep all follow-up visits as told by your health care provider and therapist. This is important. Alcohol Use  Do not drink alcohol if: ? Your health care provider tells you not to drink. ? You are pregnant, may be pregnant, or are planning to become pregnant.  If you drink alcohol, limit how much you have: ? 0-1 drink a day for women. ? 0-2 drinks a day for men.  Be aware of how much alcohol is in your drink. In the [place] one  drink equals one typical bottle of beer (12 oz), one-half glass of wine (5 oz), or one shot of hard liquor (1 oz). Contact a health care provider if you have:  Constant pain.  Weight loss.  Difficulty or pain when swallowing.  Diarrhea that gets worse. Get help right away if you have:  Severe abdominal pain.  Fever.  Diarrhea with symptoms of dehydration, such as dizziness or dry mouth.  Bright red blood in your stool.  Stool that is black and tarry.  Abdominal swelling.  Vomiting that does not stop.  Blood in your vomit. Summary  Irritable bowel syndrome (IBS) is not one specific disease. It is a group of symptoms that affects digestion.  Your intestines may become more sensitive and overreact to certain things. This may be especially true when you eat certain foods or when you are under stress.  There is no cure for IBS, but treatment can help relieve symptoms. This information is not intended to replace advice given to you by your health care provider. Make sure you discuss any questions you have with your health care provider. Document Released: 12/04/2005 Document Revised: 11/27/2017 Document Reviewed: 11/27/2017 Elsevier Interactive Patient Education  2019 [employer].

## 2019-01-10 NOTE — Progress Notes (Signed)
Established Patient Office Visit  Subjective:  Patient ID: Tommy Ortega, male    DOB: 01/30/1981  Age: 38 y.o. MRN: [id number]  CC:  Chief Complaint  Patient presents with  . Establish Care  . Annual Exam    HPI Tommy Ortega presents for establishment of care complete physical exam.  He has had a cough over the last few weeks productive of purulent phlegm.  There is been little reactive airway disease.  He does not have a history of asthma.  Does have a history of tobacco use.  There is been no fevers or chills nausea or vomiting.  He tells of ongoing history of crampy abdominal pain with intermittent constipation.  There is been no significant weight loss with this issue.  Denies any history of rectal bleeding with blood on his stool.  He works for the [employer] during the safety of the [employer]s that [employer] use.  He lives with his grandmother and his helping with her care.  He admits to using her prescription opiates on a rare limited basis.  Smokes marijuana on occasion.  Consumes up to 2 alcoholic drinks daily.  Significant past medical history of heroin and opiate abuse.  If there are treatment is status post Suboxone therapy.  He shares custody of his 41 year old son.  His son is the light of his world.  Past Medical History:  Diagnosis Date  . Chicken pox   . GERD (gastroesophageal reflux disease)     Past Surgical History:  Procedure Laterality Date  . HAND SURGERY Left     Family History  Problem Relation Age of Onset  . Cancer Mother   . Arthritis Maternal Grandmother   . Cancer Maternal Grandmother   . Diabetes Maternal Grandmother   . Heart attack Maternal Grandfather     Social History   Socioeconomic History  . Marital status: Single    Spouse name: Not on file  . Number of children: Not on file  . Years of education: Not on file  . Highest education level: Not on file  Occupational History  . Not on file  Social Needs  . Financial  resource strain: Not on file  . Food insecurity:    Worry: Not on file    Inability: Not on file  . Transportation needs:    Medical: Not on file    Non-medical: Not on file  Tobacco Use  . Smoking status: Current Every Day Smoker  . Smokeless tobacco: Current User    Types: Chew  Substance and Sexual Activity  . Alcohol use: Yes    Comment: 2 drinks daily  . Drug use: Yes    Types: Marijuana    Comment: ho heroin and opiate abuse  . Sexual activity: Yes    Partners: Female  Lifestyle  . Physical activity:    Days per week: Not on file    Minutes per session: Not on file  . Stress: Not on file  Relationships  . Social connections:    Talks on phone: Not on file    Gets together: Not on file    Attends religious service: Not on file    Active member of club or organization: Not on file    Attends meetings of clubs or organizations: Not on file    Relationship status: Not on file  . Intimate partner violence:    Fear of current or ex partner: Not on file    Emotionally abused: Not on  file    Physically abused: Not on file    Forced sexual activity: Not on file  Other Topics Concern  . Not on file  Social History Narrative  . Not on file    Outpatient Medications Prior to Visit  Medication Sig Dispense Refill  . baclofen (LIORESAL) 10 MG tablet 1/2-1 tablet at night as needed as a muscle relaxant 20 each 0  . Buprenorphine HCl-Naloxone HCl (SUBOXONE SL) Place under the tongue.    . cloNIDine (CATAPRES) 0.1 MG tablet Take 1 tablet (0.1 mg total) by mouth 3 (three) times daily. 90 tablet 0  . diphenhydrAMINE (SOMINEX) 25 MG tablet Take 25 mg by mouth at bedtime as needed for sleep.    Marland Kitchen doxylamine, Sleep, (UNISOM) 25 MG tablet Take 25 mg by mouth at bedtime as needed.     No facility-administered medications prior to visit.     No Known Allergies  ROS Review of Systems  Constitutional: Positive for fatigue. Negative for chills, diaphoresis, fever and unexpected  weight change.  HENT: Positive for dental problem.   Eyes: Negative for photophobia and visual disturbance.  Respiratory: Positive for cough. Negative for shortness of breath and wheezing.   Cardiovascular: Negative.   Gastrointestinal: Positive for constipation. Negative for anal bleeding, blood in stool, nausea and vomiting.  Endocrine: Negative for polyphagia and polyuria.  Genitourinary: Negative.   Musculoskeletal: Negative for gait problem and joint swelling.  Skin: Negative for pallor.  Allergic/Immunologic: Negative for immunocompromised state.  Neurological: Negative for weakness and numbness.  Hematological: Does not bruise/bleed easily.  Psychiatric/Behavioral: The patient is nervous/anxious.       Objective:    Physical Exam  Constitutional: He is oriented to person, place, and time. He appears well-developed and well-nourished. No distress.  HENT:  Head: Normocephalic and atraumatic.  Right Ear: External ear normal.  Left Ear: External ear normal. A foreign body is present.  Ears:  Mouth/Throat: Uvula is midline and oropharynx is clear and moist. Abnormal dentition. Dental caries present. No oropharyngeal exudate.  Eyes: Pupils are equal, round, and reactive to light. Conjunctivae and EOM are normal. Right eye exhibits no discharge. Left eye exhibits no discharge. No scleral icterus.  Neck: Neck supple. No JVD present. No tracheal deviation present. No thyromegaly present.  Cardiovascular: Normal rate, regular rhythm and normal heart sounds.  Pulmonary/Chest: Effort normal and breath sounds normal. No stridor.  Abdominal: Soft. Bowel sounds are normal. He exhibits no distension. There is no abdominal tenderness. There is no rebound and no guarding. Hernia confirmed negative in the right inguinal area and confirmed negative in the left inguinal area.  Genitourinary: Rectum:     Guaiac result negative.     No rectal mass, anal fissure, tenderness, external hemorrhoid,  internal hemorrhoid or abnormal anal tone.  Prostate is not enlarged and not tender. Right testis shows no mass, no swelling and no tenderness. Right testis is descended. Left testis shows no mass, no swelling and no tenderness. Left testis is descended. Circumcised. No hypospadias, penile erythema or penile tenderness. No discharge found.  Lymphadenopathy:    He has no cervical adenopathy.       Right: No inguinal adenopathy present.       Left: No inguinal adenopathy present.  Neurological: He is alert and oriented to person, place, and time.  Skin: Skin is warm and dry. He is not diaphoretic.  Psychiatric: He has a normal mood and affect.    BP (!) 142/70   Ht 5'  11" (1.803 m)   Wt 171 lb 8 oz (77.8 kg)   SpO2 100%   BMI 23.92 kg/m  Wt Readings from Last 3 Encounters:  01/10/19 171 lb 8 oz (77.8 kg)  12/24/14 179 lb 12.8 oz (81.6 kg)  12/15/14 175 lb (79.4 kg)   BP Readings from Last 3 Encounters:  01/10/19 (!) 142/70  12/24/14 106/60  12/15/14 119/75   Guideline developer:  UpToDate (see UpToDate for funding source) Date Released: June 2014  Health Maintenance Due  Topic Date Due  . HIV Screening  06/27/1995  . TETANUS/TDAP  06/27/1999  . INFLUENZA VACCINE  07/18/2018    There are no preventive care reminders to display for this patient.  Lab Results  Component Value Date   TSH 1.329 12/24/2014   Lab Results  Component Value Date   WBC 9.4 12/24/2014   HGB 14.7 12/24/2014   HCT 43.1 12/24/2014   MCV 84.3 12/24/2014   PLT 237 12/24/2014   No results found for: NA, K, CHLORIDE, CO2, GLUCOSE, BUN, CREATININE, BILITOT, ALKPHOS, AST, ALT, PROT, ALBUMIN, CALCIUM, ANIONGAP, EGFR, GFR No results found for: CHOL No results found for: HDL No results found for: LDLCALC No results found for: TRIG No results found for: CHOLHDL No results found for: HGBA1C    Assessment & Plan:   Problem List Items Addressed This Visit      Respiratory   Bronchitis   Relevant  Medications   amoxicillin (AMOXIL) 500 MG capsule     Digestive   Poor dentition   Irritable bowel syndrome     Nervous and Auditory   Excessive cerumen in left ear canal     Other   H/O: substance abuse ([place]) - Primary   Healthcare maintenance   Relevant Orders   Comprehensive metabolic panel   CBC   Lipid panel   TSH   Urinalysis, Routine w reflex microscopic   HIV Antibody (routine testing w rflx)      Meds ordered this encounter  Medications  . amoxicillin (AMOXIL) 500 MG capsule    Sig: Take 1 capsule (500 mg total) by mouth 3 (three) times daily.    Dispense:  30 capsule    Refill:  0    Follow-up: Return in about 2 months (around 03/11/2019), or if symptoms worsen or fail to improve.   Drawing health maintenance labs today.  Patient was given information on ceruminosis, upper respiratory tract infection and irritable bowel syndrome.  Will start on amoxicillin for his bronchitis.  He knows that he needs to see a dentist for his dental issues.  He was given information on health maintenance and disease prevention.  He did mention that he has not been eating properly or consuming healthy foods and that information should be helpful for him.  He was given information on chemical dependency and substance use disorder.  Breast my concern about his continued use of alcohol and marijuana albeit on a limited basis per his report.  Reminded him that he should be avoiding all mood altering substances.

## 2019-01-11 LAB — HIV ANTIBODY (ROUTINE TESTING W REFLEX): HIV: NONREACTIVE

## 2019-04-16 ENCOUNTER — Telehealth: Payer: Self-pay | Admitting: Family Medicine

## 2019-04-16 NOTE — Telephone Encounter (Signed)
Called pt on behalf of Dr Doreene Burke, Dr Kirtland Bouchard originally wanted to see patient for a 2 month follow up around 03/11/2019 but it was never scheduled so I called to see if he would be fine with doing a virtual visit, left message.

## 2019-04-29 ENCOUNTER — Ambulatory Visit (INDEPENDENT_AMBULATORY_CARE_PROVIDER_SITE_OTHER): Payer: 59 | Admitting: Family Medicine

## 2019-04-29 ENCOUNTER — Other Ambulatory Visit (HOSPITAL_COMMUNITY)
Admission: RE | Admit: 2019-04-29 | Discharge: 2019-04-29 | Disposition: A | Discharge status: Home / Self Care | Payer: 59 | Source: Ambulatory Visit | Attending: Family Medicine | Admitting: Family Medicine

## 2019-04-29 ENCOUNTER — Encounter: Payer: Self-pay | Admitting: Family Medicine

## 2019-04-29 VITALS — BP 138/80 | HR 99 | Ht 71.0 in

## 2019-04-29 DIAGNOSIS — S76211A Strain of adductor muscle, fascia and tendon of right thigh, initial encounter: Secondary | ICD-10-CM

## 2019-04-29 DIAGNOSIS — Z7721 Contact with and (suspected) exposure to potentially hazardous body fluids: Secondary | ICD-10-CM

## 2019-04-29 LAB — URINALYSIS, ROUTINE W REFLEX MICROSCOPIC
Bilirubin Urine: NEGATIVE
Hgb urine dipstick: NEGATIVE
Ketones, ur: NEGATIVE
Leukocytes,Ua: NEGATIVE
Nitrite: NEGATIVE
RBC / HPF: NONE SEEN (ref 0–?)
Specific Gravity, Urine: 1.025 (ref 1.000–1.030)
Total Protein, Urine: NEGATIVE
Urine Glucose: NEGATIVE
Urobilinogen, UA: 0.2 (ref 0.0–1.0)
WBC, UA: NONE SEEN (ref 0–?)
pH: 6.5 (ref 5.0–8.0)

## 2019-04-29 MED ORDER — MELOXICAM 15 MG PO TABS: 15.0000 mg | ORAL_TABLET | Freq: Every day | ORAL | 0 refills | Status: DC

## 2019-04-29 NOTE — Progress Notes (Signed)
Established Patient Office Visit  Subjective:  Patient ID: Tommy Ortega, male Tommy Ortega   DOB: 06/06/1981  Age: 38 y.o. MRN: [id number]  CC:  Chief Complaint  Patient presents with  . Hernia    HPI Tommy Ortega presents for evaluation and treatment of what he fears may be an inguinal hernia.  There is been some tenderness in his right groin area.  Denies definite tenderness or swelling in the testicle epididymis.  There is no dysuria or discharge.  In a stable relationship over the last 6 years.  Significant other has no symptoms.  He does engage in heavy lifting at work.  He has tried no medicines for this.  Past Medical History:  Diagnosis Date  . Chicken pox   . GERD (gastroesophageal reflux disease)     Past Surgical History:  Procedure Laterality Date  . HAND SURGERY Left     Family History  Problem Relation Age of Onset  . Cancer Mother   . Arthritis Maternal Grandmother   . Cancer Maternal Grandmother   . Diabetes Maternal Grandmother   . Heart attack Maternal Grandfather     Social History   Socioeconomic History  . Marital status: Single    Spouse name: Not on file  . Number of children: Not on file  . Years of education: Not on file  . Highest education level: Not on file  Occupational History  . Not on file  Social Needs  . Financial resource strain: Not on file  . Food insecurity:    Worry: Not on file    Inability: Not on file  . Transportation needs:    Medical: Not on file    Non-medical: Not on file  Tobacco Use  . Smoking status: Current Every Day Smoker  . Smokeless tobacco: Current User    Types: Chew  Substance and Sexual Activity  . Alcohol use: Yes    Comment: 2 drinks daily  . Drug use: Yes    Types: Marijuana    Comment: ho heroin and opiate abuse  . Sexual activity: Yes    Partners: Female  Lifestyle  . Physical activity:    Days per week: Not on file    Minutes per session: Not on file  . Stress: Not on file   Relationships  . Social connections:    Talks on phone: Not on file    Gets together: Not on file    Attends religious service: Not on file    Active member of club or organization: Not on file    Attends meetings of clubs or organizations: Not on file    Relationship status: Not on file  . Intimate partner violence:    Fear of current or ex partner: Not on file    Emotionally abused: Not on file    Physically abused: Not on file    Forced sexual activity: Not on file  Other Topics Concern  . Not on file  Social History Narrative  . Not on file    Outpatient Medications Prior to Visit  Medication Sig Dispense Refill  . amoxicillin (AMOXIL) 500 MG capsule Take 1 capsule (500 mg total) by mouth 3 (three) times daily. 30 capsule 0   No facility-administered medications prior to visit.     No Known Allergies  ROS Review of Systems  Constitutional: Negative for chills, diaphoresis, fatigue, fever and unexpected weight change.  HENT: Negative.   Eyes: Negative for photophobia and visual disturbance.  Respiratory:  Negative.   Cardiovascular: Negative.   Gastrointestinal: Negative for abdominal distention and abdominal pain.  Endocrine: Negative for polyphagia and polyuria.  Genitourinary: Negative for difficulty urinating, discharge, dysuria and frequency.  Musculoskeletal: Negative for arthralgias and myalgias.  Skin: Negative for pallor and rash.  Allergic/Immunologic: Negative for immunocompromised state.  Neurological: Negative for headaches.  Hematological: Does not bruise/bleed easily.  Psychiatric/Behavioral: The patient is nervous/anxious.       Objective:    Physical Exam  Constitutional: He is oriented to person, place, and time. He appears well-developed and well-nourished. No distress.  HENT:  Head: Normocephalic and atraumatic.  Right Ear: External ear normal.  Left Ear: External ear normal.  Mouth/Throat: No oropharyngeal exudate.  Eyes: Conjunctivae  are normal. Right eye exhibits no discharge. Left eye exhibits no discharge. No scleral icterus.  Neck: No JVD present. No tracheal deviation present.  Cardiovascular: Normal rate, regular rhythm and normal heart sounds.  Pulmonary/Chest: Effort normal and breath sounds normal. No stridor. No respiratory distress. He has no wheezes. He has no rales.  Abdominal: Soft. Bowel sounds are normal. He exhibits no distension. There is no abdominal tenderness. There is no rebound and no guarding. Hernia confirmed negative in the right inguinal area and confirmed negative in the left inguinal area.  Genitourinary:    Penis normal.  Right testis shows no mass, no swelling and no tenderness. Right testis is descended. Left testis shows no mass, no swelling and no tenderness. Left testis is descended. Circumcised. No hypospadias, penile erythema or penile tenderness. No discharge found.  Lymphadenopathy:       Right: No inguinal adenopathy present.       Left: No inguinal adenopathy present.  Neurological: He is alert and oriented to person, place, and time.  Skin: Skin is warm and dry. He is not diaphoretic.  Psychiatric: He has a normal mood and affect. His behavior is normal.    BP 138/80   Pulse 99   Ht 5\' 11"  (1.803 m)   SpO2 100%   BMI 23.92 kg/m  Wt Readings from Last 3 Encounters:  01/10/19 171 lb 8 oz (77.8 kg)  12/24/14 179 lb 12.8 oz (81.6 kg)  12/15/14 175 lb (79.4 kg)   BP Readings from Last 3 Encounters:  04/29/19 138/80  01/10/19 (!) 142/70  12/24/14 106/60   Guideline developer:  UpToDate (see UpToDate for funding source) Date Released: June 2014  Health Maintenance Due  Topic Date Due  . Janet Berlin  06/27/1999    There are no preventive care reminders to display for this patient.  Lab Results  Component Value Date   TSH 0.69 01/10/2019   Lab Results  Component Value Date   WBC 8.9 01/10/2019   HGB 15.1 01/10/2019   HCT 44.8 01/10/2019   MCV 88.3 01/10/2019    PLT 222.0 01/10/2019   Lab Results  Component Value Date   NA 138 01/10/2019   K 4.1 01/10/2019   CO2 28 01/10/2019   GLUCOSE 84 01/10/2019   BUN 11 01/10/2019   CREATININE 1.05 01/10/2019   BILITOT 0.6 01/10/2019   ALKPHOS 63 01/10/2019   AST 22 01/10/2019   ALT 26 01/10/2019   PROT 7.7 01/10/2019   ALBUMIN 4.8 01/10/2019   CALCIUM 9.9 01/10/2019   GFR 78.82 01/10/2019   Lab Results  Component Value Date   CHOL 190 01/10/2019   Lab Results  Component Value Date   HDL 42.30 01/10/2019   Lab Results  Component Value Date  LDLCALC 118 (H) 01/10/2019   Lab Results  Component Value Date   TRIG 149.0 01/10/2019   Lab Results  Component Value Date   CHOLHDL 4 01/10/2019   No results found for: HGBA1C    Assessment & Plan:   Problem List Items Addressed This Visit    None    Visit Diagnoses    Groin strain, right, initial encounter    -  Primary   Relevant Medications   meloxicam (MOBIC) 15 MG tablet   Patient exposure to body fluids       Relevant Orders   Urine cytology ancillary only   Urinalysis, Routine w reflex microscopic      Meds ordered this encounter  Medications  . meloxicam (MOBIC) 15 MG tablet    Sig: Take 1 tablet (15 mg total) by mouth daily.    Dispense:  30 tablet    Refill:  0    Follow-up: Return if symptoms worsen or fail to improve.

## 2019-04-30 LAB — URINE CYTOLOGY ANCILLARY ONLY
Chlamydia: NEGATIVE
Neisseria Gonorrhea: NEGATIVE
Trichomonas: NEGATIVE

## 2019-05-01 ENCOUNTER — Other Ambulatory Visit: Payer: Self-pay

## 2019-05-01 MED ORDER — DOXYCYCLINE HYCLATE 100 MG PO TABS: 100.0000 mg | ORAL_TABLET | Freq: Two times a day (BID) | ORAL | 0 refills | Status: AC

## 2019-12-01 ENCOUNTER — Other Ambulatory Visit: Payer: Self-pay

## 2019-12-02 ENCOUNTER — Encounter: Payer: Self-pay | Admitting: Family Medicine

## 2019-12-02 ENCOUNTER — Ambulatory Visit: Payer: 59 | Admitting: Family Medicine

## 2019-12-02 VITALS — BP 122/74 | HR 88 | Temp 97.6°F | Ht 71.0 in | Wt 177.4 lb

## 2019-12-02 DIAGNOSIS — F418 Other specified anxiety disorders: Secondary | ICD-10-CM

## 2019-12-02 DIAGNOSIS — Z23 Encounter for immunization: Secondary | ICD-10-CM | POA: Diagnosis not present

## 2019-12-02 DIAGNOSIS — I8391 Asymptomatic varicose veins of right lower extremity: Secondary | ICD-10-CM | POA: Diagnosis not present

## 2019-12-02 DIAGNOSIS — S76911A Strain of unspecified muscles, fascia and tendons at thigh level, right thigh, initial encounter: Secondary | ICD-10-CM

## 2019-12-02 LAB — D-DIMER, QUANTITATIVE: D-Dimer, Quant: <0.19 mcg/mL FEU (ref <0.50)

## 2019-12-02 MED ORDER — PAROXETINE HCL 10 MG PO TABS: ORAL_TABLET | ORAL | 0 refills | Status: DC
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
## 2019-12-02 NOTE — Patient Instructions (Signed)
Varicose Veins Varicose veins are veins that have become enlarged, bulged, and twisted. They most often appear in the legs. What are the causes? This condition is caused by damage to the valves in the vein. These valves help blood return to your heart. When they are damaged and they stop working properly, blood may flow backward and back up in the veins near the skin, causing the veins to get larger and appear twisted. The condition can result from any issue that causes blood to back up, like pregnancy, prolonged standing, or obesity. What increases the risk? This condition is more likely to develop in people who are:  On their feet a lot.  Pregnant.  Overweight. What are the signs or symptoms? Symptoms of this condition include:  Bulging, twisted, and bluish veins.  A feeling of heaviness. This may be worse at the end of the day.  Leg pain. This may be worse at the end of the day.  Swelling in the leg.  Changes in skin color over the veins. How is this diagnosed? This condition may be diagnosed based on your symptoms, a physical exam, and an ultrasound test. How is this treated? Treatment for this condition may involve:  Avoiding sitting or standing in one position for long periods of time.  Wearing compression stockings. These stockings help to prevent blood clots and reduce swelling in the legs.  Raising (elevating) the legs when resting.  Losing weight.  Exercising regularly. If you have persistent symptoms or want to improve the way your varicose veins look, you may choose to have a procedure to close the varicose veins off or to remove them. Treatments to close off the veins include:  Sclerotherapy. In this treatment, a solution is injected into a vein to close it off.  Laser treatment. In this treatment, the vein is heated with a laser to close it off.  Radiofrequency vein ablation. In this treatment, an electrical current produced by radio waves is used to close  off the vein. Treatments to remove the veins include:  Phlebectomy. In this treatment, the veins are removed through small incisions made over the veins.  Vein ligation and stripping. In this treatment, incisions are made over the veins. The veins are then removed after being tied (ligated) with stitches (sutures). Follow these instructions at home: Activity  Walk as much as possible. Walking increases blood flow. This helps blood return to the heart and takes pressure off your veins. It also increases your cardiovascular strength.  Follow your health care provider's instructions about exercising.  Do not stand or sit in one position for a long period of time.  Do not sit with your legs crossed.  Rest with your legs raised during the day. General instructions   Follow any diet instructions given to you by your health care provider.  Wear compression stockings as directed by your health care provider. Do not wear other kinds of tight clothing around your legs, pelvis, or waist.  Elevate your legs at night to above the level of your heart.  If you get a cut in the skin over the varicose vein and the vein bleeds: ? Lie down with your leg raised. ? Apply firm pressure to the cut with a clean cloth until the bleeding stops. ? Place a bandage (dressing) on the cut. Contact a health care provider if:  The skin around your varicose veins starts to break down.  You have pain, redness, tenderness, or hard swelling over a vein.  You   are uncomfortable because of pain.  You get a cut in the skin over a varicose vein and it will not stop bleeding. Summary  Varicose veins are veins that have become enlarged, bulged, and twisted. They most often appear in the legs.  This condition is caused by damage to the valves in the vein. These valves help blood return to your heart.  Treatment for this condition includes frequent movements, wearing compression stockings, losing weight, and  exercising regularly. In some cases, procedures are done to close off or remove the veins.  Treatment for this condition may include wearing compression stockings, elevating the legs, losing weight, and engaging in regular activity. In some cases, procedures are done to close off or remove the veins. This information is not intended to replace advice given to you by your health care provider. Make sure you discuss any questions you have with your health care provider. Document Released: 09/13/2005 Document Revised: 01/30/2019 Document Reviewed: 12/27/2016 Elsevier Patient Education  2020 [employer]  Living With Anxiety  After being diagnosed with an anxiety disorder, you may be relieved to know why you have felt or behaved a certain way. It is natural to also feel overwhelmed about the treatment ahead and what it will mean for your life. With care and support, you can manage this condition and recover from it. How to cope with anxiety Dealing with stress Stress is your body's reaction to life changes and events, both good and bad. Stress can last just a few hours or it can be ongoing. Stress can play a major role in anxiety, so it is important to learn both how to cope with stress and how to think about it differently. Talk with your health care provider or a counselor to learn more about stress reduction. He or she may suggest some stress reduction techniques, such as:  Music therapy. This can include creating or listening to music that you enjoy and that inspires you.  Mindfulness-based meditation. This involves being aware of your normal breaths, rather than trying to control your breathing. It can be done while sitting or walking.  Centering prayer. This is a kind of meditation that involves focusing on a word, phrase, or sacred image that is meaningful to you and that brings you peace.  Deep breathing. To do this, expand your stomach and inhale slowly through your nose. Hold your breath  for 3-5 seconds. Then exhale slowly, allowing your stomach muscles to relax.  Self-talk. This is a skill where you identify thought patterns that lead to anxiety reactions and correct those thoughts.  Muscle relaxation. This involves tensing muscles then relaxing them. Choose a stress reduction technique that fits your lifestyle and personality. Stress reduction techniques take time and practice. Set aside 5-15 minutes a day to do them. Therapists can offer training in these techniques. The training may be covered by some insurance plans. Other things you can do to manage stress include:  Keeping a stress diary. This can help you learn what triggers your stress and ways to control your response.  Thinking about how you respond to certain situations. You may not be able to control everything, but you can control your reaction.  Making time for activities that help you relax, and not feeling guilty about spending your time in this way. Therapy combined with coping and stress-reduction skills provides the best chance for successful treatment. Medicines Medicines can help ease symptoms. Medicines for anxiety include:  Anti-anxiety drugs.  Antidepressants.  Beta-blockers. Medicines may  be used as the main treatment for anxiety disorder, along with therapy, or if other treatments are not working. Medicines should be prescribed by a health care provider. Relationships Relationships can play a big part in helping you recover. Try to spend more time connecting with trusted friends and family members. Consider going to couples counseling, taking family education classes, or going to family therapy. Therapy can help you and others better understand the condition. How to recognize changes in your condition Everyone has a different response to treatment for anxiety. Recovery from anxiety happens when symptoms decrease and stop interfering with your daily activities at home or work. This may mean that you  will start to:  Have better concentration and focus.  Sleep better.  Be less irritable.  Have more energy.  Have improved memory. It is important to recognize when your condition is getting worse. Contact your health care provider if your symptoms interfere with home or work and you do not feel like your condition is improving. Where to find help and support: You can get help and support from these sources:  Self-help groups.  Online and [employer].  A trusted spiritual leader.  Couples counseling.  Family education classes.  Family therapy. Follow these instructions at home:  Eat a healthy diet that includes plenty of vegetables, fruits, whole grains, low-fat dairy products, and lean protein. Do not eat a lot of foods that are high in solid fats, added sugars, or salt.  Exercise. Most adults should do the following: ? Exercise for at least 150 minutes each week. The exercise should increase your heart rate and make you sweat (moderate-intensity exercise). ? Strengthening exercises at least twice a week.  Cut down on caffeine, tobacco, alcohol, and other potentially harmful substances.  Get the right amount and quality of sleep. Most adults need 7-9 hours of sleep each night.  Make choices that simplify your life.  Take over-the-counter and prescription medicines only as told by your health care provider.  Avoid caffeine, alcohol, and certain over-the-counter cold medicines. These may make you feel worse. Ask your pharmacist which medicines to avoid.  Keep all follow-up visits as told by your health care provider. This is important. Questions to ask your health care provider  Would I benefit from therapy?  How often should I follow up with a health care provider?  How long do I need to take medicine?  Are there any long-term side effects of my medicine?  Are there any alternatives to taking medicine? Contact a health care provider if:  You have a  hard time staying focused or finishing daily tasks.  You spend many hours a day feeling worried about everyday life.  You become exhausted by worry.  You start to have headaches, feel tense, or have nausea.  You urinate more than normal.  You have diarrhea. Get help right away if:  You have a racing heart and shortness of breath.  You have thoughts of hurting yourself or others. If you ever feel like you may hurt yourself or others, or have thoughts about taking your own life, get help right away. You can go to your nearest emergency department or call:  Your local emergency services (911 in the [place]  A suicide crisis helpline, such as the National Suicide Prevention Lifeline at 7725302392. This is open 24-hours a day. Summary  Taking steps to deal with stress can help calm you.  Medicines cannot cure anxiety disorders, but they can help ease symptoms.  Family,  friends, and partners can play a big part in helping you recover from an anxiety disorder. This information is not intended to replace advice given to you by your health care provider. Make sure you discuss any questions you have with your health care provider. Document Released: 11/28/2016 Document Revised: 11/16/2017 Document Reviewed: 11/28/2016 Elsevier Patient Education  2020 [employer].

## 2019-12-03 ENCOUNTER — Encounter: Payer: Self-pay | Admitting: Family Medicine

## 2019-12-03 ENCOUNTER — Ambulatory Visit: Payer: 59 | Admitting: Family Medicine

## 2019-12-03 NOTE — Progress Notes (Signed)
Ddimer test was negative. No evidence for blood clot.

## 2019-12-04 ENCOUNTER — Telehealth: Payer: Self-pay | Admitting: Family Medicine

## 2019-12-04 MED ORDER — ESCITALOPRAM OXALATE 10 MG PO TABS: ORAL_TABLET | ORAL | 1 refills | Status: DC

## 2019-12-04 NOTE — Telephone Encounter (Signed)
Switched to [employer].

## 2019-12-04 NOTE — Telephone Encounter (Signed)
Pt is aware that Dr. Ethelene Hal switched his medication & why. He understood & had no additional questions

## 2019-12-04 NOTE — Telephone Encounter (Signed)
Dr. Ethelene Hal,  Received a fax from pt's pharmacy that his paroxetine is not a covered medication. It requires a prior authorization. Do you want me to do a PA to see if it will get approved or would you like to switch pt to something else.   [phone]. Pt I.D [id number]

## 2019-12-04 NOTE — Addendum Note (Signed)
Addended by: Abelino Derrick A on: 12/04/2019 09:30 AM   Modules accepted: Orders

## 2019-12-08 NOTE — Telephone Encounter (Signed)
Dr. Ethelene Hal,  So you switched pt's paxil to lexapro due to insurance not covering it. I received another fax from pharmacy stating pt's insurance will not cover the lexapro.  I attempted to reach pt to have him reach out to his insurance for covered alternatives but no answer. Left vm to call back.  Did you want me to do the prior auth for the lexapro or just await to hear from pt

## 2019-12-09 MED ORDER — SERTRALINE HCL 25 MG PO TABS: ORAL_TABLET | ORAL | 0 refills | Status: DC

## 2019-12-09 NOTE — Addendum Note (Signed)
Addended by: Jon Billings on: 12/09/2019 01:10 PM   Modules accepted: Orders

## 2019-12-09 NOTE — Telephone Encounter (Signed)
Have sent a rx for zoloft to his pharmacy. If his insurance doesn't cover this medicine, I would like to see a list of what they will cover.

## 2020-01-02 ENCOUNTER — Ambulatory Visit: Payer: 59 | Admitting: Family Medicine

## 2020-11-19 ENCOUNTER — Ambulatory Visit: Admission: EM | Admit: 2020-11-19 | Discharge: 2020-11-19 | Discharge status: Against Medical Advice | Payer: 59

## 2020-11-19 ENCOUNTER — Ambulatory Visit: Payer: Self-pay

## 2020-12-29 ENCOUNTER — Telehealth: Payer: 59 | Admitting: Nurse Practitioner

## 2020-12-29 DIAGNOSIS — M5441 Lumbago with sciatica, right side: Secondary | ICD-10-CM

## 2020-12-29 MED ORDER — NAPROXEN 500 MG PO TABS: 500.0000 mg | ORAL_TABLET | Freq: Two times a day (BID) | ORAL | 0 refills | Status: DC

## 2020-12-29 MED ORDER — CYCLOBENZAPRINE HCL 10 MG PO TABS: 10.0000 mg | ORAL_TABLET | Freq: Three times a day (TID) | ORAL | 0 refills | Status: DC | PRN

## 2020-12-29 NOTE — Progress Notes (Signed)

## 2021-02-11 ENCOUNTER — Telehealth: Payer: Self-pay | Admitting: Physician Assistant

## 2021-02-11 DIAGNOSIS — J029 Acute pharyngitis, unspecified: Secondary | ICD-10-CM

## 2021-02-11 NOTE — Progress Notes (Signed)
We are sorry that you are not feeling well.  Here is how we plan to help!  Your symptoms indicate a likely viral infection (Pharyngitis).   Pharyngitis is inflammation in the back of the throat which can cause a sore throat, scratchiness and sometimes difficulty swallowing.   Pharyngitis is typically caused by a respiratory virus and will just run its course.  Please keep in mind that your symptoms could last up to 10 days.    For throat pain, we recommend over the counter oral pain relief medications such as acetaminophen or aspirin, or anti-inflammatory medications such as ibuprofen or naproxen sodium.  Topical treatments such as oral throat lozenges or sprays may be used as needed.  Avoid close contact with loved ones, especially the very young and elderly.  Remember to wash your hands thoroughly throughout the day as this is the number one way to prevent the spread of infection and wipe down door knobs and counters with disinfectant.  After careful review of your answers, I would not recommend and antibiotic for your condition.  Antibiotics should not be used to treat conditions that we suspect are caused by viruses like the virus that causes the common cold or flu. However, some people can have Strep with atypical symptoms. You may need formal testing in clinic or office to confirm if your symptoms continue or worsen.  Providers prescribe antibiotics to treat infections caused by bacteria. Antibiotics are very powerful in treating bacterial infections when they are used properly.  To maintain their effectiveness, they should be used only when necessary.  Overuse of antibiotics has resulted in the development of super bugs that are resistant to treatment!    Home Care:  Only take medications as instructed by your medical team.  Do not drink alcohol while taking these medications.  A steam or ultrasonic humidifier can help congestion.  You can place a towel over your head and breathe in the steam  from hot water coming from a faucet.  Avoid close contacts especially the very young and the elderly.  Cover your mouth when you cough or sneeze.  Always remember to wash your hands.  Get Help Right Away If:  You develop worsening fever or throat pain.  You develop a severe head ache or visual changes.  Your symptoms persist after you have completed your treatment plan.  Make sure you  Understand these instructions.  Will watch your condition.  Will get help right away if you are not doing well or get worse.  Your e-visit answers were reviewed by a board certified advanced clinical practitioner to complete your personal care plan.  Depending on the condition, your plan could have included both over the counter or prescription medications.  If there is a problem please reply  once you have received a response from your provider.  Your safety is important to us.  If you have drug allergies check your prescription carefully.    You can use MyChart to ask questions about todays visit, request a non-urgent call back, or ask for a work or school excuse for 24 hours related to this e-Visit. If it has been greater than 24 hours you will need to follow up with your provider, or enter a new e-Visit to address those concerns.  You will get an e-mail in the next two days asking about your experience.  I hope that your e-visit has been valuable and will speed your recovery. Thank you for using e-visits.   Greater than   5 minutes, yet less than 10 minutes of time have been spent researching, coordinating and implementing care for this patient today.   

## 2021-03-12 ENCOUNTER — Telehealth: Payer: Self-pay | Admitting: Nurse Practitioner

## 2021-03-12 DIAGNOSIS — J029 Acute pharyngitis, unspecified: Secondary | ICD-10-CM

## 2021-03-12 MED ORDER — AMOXICILLIN 500 MG PO CAPS: 500.0000 mg | ORAL_CAPSULE | Freq: Two times a day (BID) | ORAL | 0 refills | Status: DC

## 2021-03-12 NOTE — Progress Notes (Signed)

## 2021-03-23 ENCOUNTER — Other Ambulatory Visit: Payer: Self-pay

## 2021-03-23 ENCOUNTER — Ambulatory Visit: Admit: 2021-03-23 | Disposition: A | Discharge status: Home / Self Care | Payer: 59

## 2021-03-23 ENCOUNTER — Ambulatory Visit
Admission: EM | Admit: 2021-03-23 | Discharge: 2021-03-23 | Disposition: A | Discharge status: Home / Self Care | Payer: 59 | Attending: Emergency Medicine | Admitting: Emergency Medicine

## 2021-03-23 ENCOUNTER — Encounter: Payer: Self-pay | Admitting: Emergency Medicine

## 2021-03-23 DIAGNOSIS — R591 Generalized enlarged lymph nodes: Secondary | ICD-10-CM | POA: Diagnosis not present

## 2021-03-23 DIAGNOSIS — J029 Acute pharyngitis, unspecified: Secondary | ICD-10-CM | POA: Diagnosis not present

## 2021-03-23 LAB — POCT RAPID STREP A (OFFICE): Rapid Strep A Screen: NEGATIVE

## 2021-03-23 MED ORDER — IBUPROFEN 800 MG PO TABS: 800.0000 mg | ORAL_TABLET | Freq: Three times a day (TID) | ORAL | 0 refills | Status: DC

## 2021-03-23 MED ORDER — CETIRIZINE HCL 10 MG PO CAPS: 10.0000 mg | ORAL_CAPSULE | Freq: Every day | ORAL | 0 refills | Status: DC

## 2021-03-23 NOTE — Discharge Instructions (Signed)
Use anti-inflammatories for pain/swelling. You may take up to 800 mg Ibuprofen every 8 hours with food. You may supplement Ibuprofen with Tylenol [phone] mg every 8 hours.  Warm compresses to neck Daily cetirizine/Zyrtec or loratadine/Claritin over the next week help with any allergies/postnasal drainage contributing to symptoms Follow-up with primary care is swollen lymph nodes persisting

## 2021-03-23 NOTE — ED Triage Notes (Signed)
Pt sts sore throat x 2 weeks; pt finished up course of amoxicillin today without improvement

## 2021-03-23 NOTE — ED Provider Notes (Signed)
[facility]    CSN: [id number] Arrival date & time: 03/23/21  1301      History   Chief Complaint Chief Complaint  Patient presents with  . Sore Throat    HPI Tommy Ortega is a 40 y.o. male history of GERD presenting today for evaluation of sore throat.  Reports sore throat for approximately 2 weeks, more prominent on right side.  Reports son recently had strep, he completed E visit previously and was prescribed amoxicillin.  Completed a course of amoxicillin today, but reports continued discomfort on right side of throat.  Noticed to "balls" in this area.  Feels more irritated.  Also reports some scratchy throat and drainage with occasional cough.  HPI  Past Medical History:  Diagnosis Date  . Chicken pox   . GERD (gastroesophageal reflux disease)     Patient Active Problem List   Diagnosis Date Noted  . Muscle strain of right thigh 12/02/2019  . Needs flu shot 12/02/2019  . Asymptomatic varicose veins of right lower extremity 12/02/2019  . H/O: substance abuse (HCC) 01/10/2019  . Poor dentition 01/10/2019  . Bronchitis 01/10/2019  . Irritable bowel syndrome 01/10/2019  . Excessive cerumen in left ear canal 01/10/2019  . Healthcare maintenance 01/10/2019    Past Surgical History:  Procedure Laterality Date  . HAND SURGERY Left        Home Medications    Prior to Admission medications   Medication Sig Start Date End Date Taking? Authorizing Provider  Cetirizine HCl 10 MG CAPS Take 1 capsule (10 mg total) by mouth daily for 10 days. 03/23/21 04/02/21 Yes Daymon Hora C, PA-C  ibuprofen (ADVIL) 800 MG tablet Take 1 tablet (800 mg total) by mouth 3 (three) times daily. 03/23/21  Yes Yuji Walth C, PA-C  cyclobenzaprine (FLEXERIL) 10 MG tablet Take 1 tablet (10 mg total) by mouth 3 (three) times daily as needed for muscle spasms. 12/29/20   Daphine Deutscher Mary-Margaret, FNP  sertraline (ZOLOFT) 25 MG tablet Take one tablet by mouth daily for one week and  then increase to two daily. 12/09/19   Mliss Sax, MD    Family History Family History  Problem Relation Age of Onset  . Cancer Mother   . Arthritis Maternal Grandmother   . Cancer Maternal Grandmother   . Diabetes Maternal Grandmother   . Heart attack Maternal Grandfather     Social History Social History   Tobacco Use  . Smoking status: Current Every Day Smoker  . Smokeless tobacco: Current User    Types: Chew  Substance Use Topics  . Alcohol use: Yes    Comment: rarely now   . Drug use: Not Currently    Comment: ho heroin and opiate abuse, quit smoking marihuana this past June after his father's passing     Allergies   Patient has no known allergies.   Review of Systems Review of Systems  Constitutional: Negative for activity change, appetite change, chills, fatigue and fever.  HENT: Positive for congestion and sore throat. Negative for ear pain, rhinorrhea, sinus pressure and trouble swallowing.   Eyes: Negative for discharge and redness.  Respiratory: Positive for cough. Negative for chest tightness and shortness of breath.   Cardiovascular: Negative for chest pain.  Gastrointestinal: Negative for abdominal pain, diarrhea, nausea and vomiting.  Musculoskeletal: Negative for myalgias.  Skin: Negative for rash.  Neurological: Negative for dizziness, light-headedness and headaches.     Physical Exam Triage Vital Signs ED Triage Vitals  Enc  Vitals Group     BP 03/23/21 1446 (!) 132/91     Pulse Rate 03/23/21 1446 74     Resp 03/23/21 1446 18     Temp 03/23/21 1446 98.5 F (36.9 C)     Temp Source 03/23/21 1446 Oral     SpO2 03/23/21 1446 97 %     Weight --      Height --      Head Circumference --      Peak Flow --      Pain Score 03/23/21 1447 4     Pain Loc --      Pain Edu? --      Excl. in GC? --    No data found.  Updated Vital Signs BP (!) 132/91 (BP Location: Left Arm)   Pulse 74   Temp 98.5 F (36.9 C) (Oral)   Resp 18    SpO2 97%   Visual Acuity Right Eye Distance:   Left Eye Distance:   Bilateral Distance:    Right Eye Near:   Left Eye Near:    Bilateral Near:     Physical Exam Vitals and nursing note reviewed.  Constitutional:      Appearance: He is well-developed.     Comments: No acute distress  HENT:     Head: Normocephalic and atraumatic.     Ears:     Comments: Bilateral ears without tenderness to palpation of external auricle, tragus and mastoid, EAC's without erythema or swelling,   Left TM not visualized due to cerumen impaction Right TM pearly gray with good bony landmarks     Nose: Nose normal.     Mouth/Throat:     Comments: Oral mucosa pink and moist, no tonsillar enlargement or exudate. Posterior pharynx patent and nonerythematous, no uvula deviation or swelling. Normal phonation.  Eyes:     Conjunctiva/sclera: Conjunctivae normal.  Neck:     Comments: 2 areas of lymphadenopathy noted to right anterior cervical chain/tonsillar area, no overlying erythema or induration Cardiovascular:     Rate and Rhythm: Normal rate.  Pulmonary:     Effort: Pulmonary effort is normal. No respiratory distress.  Abdominal:     General: There is no distension.  Musculoskeletal:        General: Normal range of motion.     Cervical back: Neck supple.  Skin:    General: Skin is warm and dry.  Neurological:     Mental Status: He is alert and oriented to person, place, and time.      UC Treatments / Results  Labs (all labs ordered are listed, but only abnormal results are displayed) Labs Reviewed  CULTURE, GROUP A STREP [facility])  POCT RAPID STREP A (OFFICE)    EKG   Radiology No results found.  Procedures Procedures (including critical care time)  Medications Ordered in UC Medications - No data to display  Initial Impression / Assessment and Plan / UC Course  I have reviewed the triage vital signs and the nursing notes.  Pertinent labs & imaging results that were available  during my care of the patient were reviewed by me and considered in my medical decision making (see chart for details).     Strep negative, suspect sore throat likely related to lymphadenopathy on right side, otherwise no signs of tonsillitis/peritonsillar abscess or other infectious etiology at this time.  No swollen salivary glands.  Recommending anti-inflammatories warm compresses and follow-up with PCP for further evaluation if lymphadenopathy persists or worsens.  Encourage daily antihistamine to help with any throat irritation/drainage related to allergies.  Discussed strict return precautions. Patient verbalized understanding and is agreeable with plan.  Final Clinical Impressions(s) / UC Diagnoses   Final diagnoses:  Sore throat  Lymphadenopathy     Discharge Instructions     Use anti-inflammatories for pain/swelling. You may take up to 800 mg Ibuprofen every 8 hours with food. You may supplement Ibuprofen with Tylenol [phone] mg every 8 hours.  Warm compresses to neck Daily cetirizine/Zyrtec or loratadine/Claritin over the next week help with any allergies/postnasal drainage contributing to symptoms Follow-up with primary care is swollen lymph nodes persisting    ED Prescriptions    Medication Sig Dispense Auth. Provider   ibuprofen (ADVIL) 800 MG tablet Take 1 tablet (800 mg total) by mouth 3 (three) times daily. 21 tablet Pinkey Mcjunkin C, PA-C   Cetirizine HCl 10 MG CAPS Take 1 capsule (10 mg total) by mouth daily for 10 days. 10 capsule Rodgers Likes, [place] C, PA-C     PDMP not reviewed this encounter.   Lew Dawes, [place] 03/23/21 1549

## 2021-03-26 LAB — CULTURE, GROUP A STREP (THRC)

## 2021-03-30 ENCOUNTER — Other Ambulatory Visit: Payer: Self-pay

## 2021-03-31 ENCOUNTER — Ambulatory Visit: Payer: 59 | Admitting: Family Medicine

## 2021-03-31 ENCOUNTER — Encounter: Payer: Self-pay | Admitting: Family Medicine

## 2021-03-31 VITALS — BP 134/70 | HR 87 | Temp 97.1°F | Ht 71.0 in | Wt 174.4 lb

## 2021-03-31 DIAGNOSIS — J029 Acute pharyngitis, unspecified: Secondary | ICD-10-CM

## 2021-03-31 DIAGNOSIS — R590 Localized enlarged lymph nodes: Secondary | ICD-10-CM

## 2021-03-31 LAB — CBC
HCT: 47.7 % (ref 39.0–52.0)
Hemoglobin: 15.7 g/dL (ref 13.0–17.0)
MCHC: 33 g/dL (ref 30.0–36.0)
MCV: 90.3 fl (ref 78.0–100.0)
Platelets: 164 10*3/uL (ref 150.0–400.0)
RBC: 5.28 Mil/uL (ref 4.22–5.81)
RDW: 13.5 % (ref 11.5–15.5)
WBC: 7.3 10*3/uL (ref 4.0–10.5)

## 2021-03-31 NOTE — Patient Instructions (Signed)
Lymphadenopathy  Lymphadenopathy means that your lymph glands are swollen or larger than normal. Lymph glands, also called lymph nodes, are collections of tissue that filter excess fluid, bacteria, viruses, and waste from your bloodstream. They are part of your body's disease-fighting system (immune system), which protects your body from germs. There may be different causes of lymphadenopathy, depending on where it is in your body. Some types go away on their own. Lymphadenopathy can occur anywhere that you have lymph glands, including these areas:  Neck (cervical lymphadenopathy).  Chest (mediastinal lymphadenopathy).  Lungs (hilar lymphadenopathy).  Underarms (axillary lymphadenopathy).  Groin (inguinal lymphadenopathy). When your immune system responds to germs, infection-fighting cells and fluid build up in your lymph glands. This causes some swelling and enlargement. If the lymph nodes do not go back to normal size after you have an infection or disease, your health care provider may do tests. These tests help to monitor your condition and find the reason why the glands are still swollen and enlarged. Follow these instructions at home:  Get plenty of rest.  Your health care provider may recommend over-the-counter medicines for pain. Take over-the-counter and prescription medicines only as told by your health care provider.  If directed, apply heat to swollen lymph glands as often as told by your health care provider. Use the heat source that your health care provider recommends, such as a moist heat pack or a heating pad. ? Place a towel between your skin and the heat source. ? Leave the heat on for 20-30 minutes. ? Remove the heat if your skin turns bright red. This is especially important if you are unable to feel pain, heat, or cold. You may have a greater risk of getting burned.  Check your affected lymph glands every day for changes. Check other lymph gland areas as told by your  health care provider. Check for changes such as: ? More swelling. ? Sudden increase in size. ? Redness or pain. ? Hardness.  Keep all follow-up visits. This is important.   Contact a health care provider if you have:  Lymph glands that: ? Are still swollen after 2 weeks. ? Have suddenly gotten bigger or the swelling spreads. ? Are red, painful, or hard.  Fluid leaking from the skin near an enlarged lymph gland.  Problems with breathing.  A fever, chills, or night sweats.  Fatigue.  A sore throat.  Pain in your abdomen.  Weight loss. Get help right away if you have:  Severe pain.  Chest pain.  Shortness of breath. These symptoms may represent a serious problem that is an emergency. Do not wait to see if the symptoms will go away. Get medical help right away. Call your local emergency services (911 in the [place] Do not drive yourself to the hospital. Summary  Lymphadenopathy means that your lymph glands are swollen or larger than normal.  Lymph glands, also called lymph nodes, are collections of tissue that filter excess fluid, bacteria, viruses, and waste from the bloodstream. They are part of your body's disease-fighting system (immune system).  Lymphadenopathy can occur anywhere that you have lymph glands.  If the lymph nodes do not go back to normal size after you have an infection or disease, your health care provider may do tests to monitor your condition and find the reason why the glands are still swollen and enlarged.  Check your affected lymph glands every day for changes. Check other lymph gland areas as told by your health care provider. This information   is not intended to replace advice given to you by your health care provider. Make sure you discuss any questions you have with your health care provider. Document Revised: 09/29/2020 Document Reviewed: 09/29/2020 Elsevier Patient Education  2021 [employer]  

## 2021-03-31 NOTE — Progress Notes (Signed)
Established Patient Office Visit  Subjective:  Patient ID: Tommy Ortega, male    DOB: 1981-05-23  Age: 40 y.o. MRN: [id number]  CC:  Chief Complaint  Patient presents with  . Sore Throat    Sore throat, swollen lymph nodes placed on amoxil x 10 days for possible strep throat. Lymph nodes still swollen with some discomfort. Symptoms x 3 weeks.     HPI Tommy Ortega presents for follow-up cervical adenopathy that is persisted status post pharyngitis episode some 16 days ago.  Patient was diagnosed with strep by association of his son who was diagnosed with strep and treated with 10 days of Amoxil.  Things did not seem to improve with the amoxicillin treatment at that time he complained of sore throat, elevated temperature postnasal drip difficulty swallowing and the soreness of his throat.  He did not have much of a cough.  He denies nausea or vomiting.  Denied change in taste or smell or diarrhea.  Denied marked myalgias or arthralgias.  Denied unusual fatigue.  He did have his COVID vaccines.  He is here today mostly out of concern of his adenopathy that has persisted.  Otherwise he feels well Past Medical History:  Diagnosis Date  . Chicken pox   . GERD (gastroesophageal reflux disease)     Past Surgical History:  Procedure Laterality Date  . HAND SURGERY Left     Family History  Problem Relation Age of Onset  . Cancer Mother   . Arthritis Maternal Grandmother   . Cancer Maternal Grandmother   . Diabetes Maternal Grandmother   . Heart attack Maternal Grandfather     Social History   Socioeconomic History  . Marital status: Single    Spouse name: Not on file  . Number of children: Not on file  . Years of education: Not on file  . Highest education level: Not on file  Occupational History  . Not on file  Tobacco Use  . Smoking status: Current Every Day Smoker  . Smokeless tobacco: Current User    Types: Chew  Substance and Sexual Activity  . Alcohol use: Yes     Comment: rarely now   . Drug use: Not Currently    Comment: ho heroin and opiate abuse, quit smoking marihuana this past June after his father's passing  . Sexual activity: Yes    Partners: Female  Other Topics Concern  . Not on file  Social History Narrative  . Not on file   Social Determinants of Health   Financial Resource Strain: Not on file  Food Insecurity: Not on file  Transportation Needs: Not on file  Physical Activity: Not on file  Stress: Not on file  Social Connections: Not on file  Intimate Partner Violence: Not on file    Outpatient Medications Prior to Visit  Medication Sig Dispense Refill  . ibuprofen (ADVIL) 800 MG tablet Take 1 tablet (800 mg total) by mouth 3 (three) times daily. 21 tablet 0  . Cetirizine HCl 10 MG CAPS Take 1 capsule (10 mg total) by mouth daily for 10 days. (Patient not taking: Reported on 03/31/2021) 10 capsule 0  . cyclobenzaprine (FLEXERIL) 10 MG tablet Take 1 tablet (10 mg total) by mouth 3 (three) times daily as needed for muscle spasms. (Patient not taking: Reported on 03/31/2021) 30 tablet 0  . sertraline (ZOLOFT) 25 MG tablet Take one tablet by mouth daily for one week and then increase to two daily. (Patient not taking: Reported  on 03/31/2021) 60 tablet 0   No facility-administered medications prior to visit.    No Known Allergies  ROS Review of Systems  Constitutional: Negative for chills, diaphoresis, fatigue, fever and unexpected weight change.  HENT: Negative.  Negative for sore throat, trouble swallowing and voice change.   Eyes: Negative for photophobia and visual disturbance.  Respiratory: Negative.   Cardiovascular: Negative.   Gastrointestinal: Negative.   Genitourinary: Negative.   Musculoskeletal: Negative for arthralgias and neck stiffness.  Neurological: Negative for headaches.  Psychiatric/Behavioral: Negative.       Objective:    Physical Exam Vitals and nursing note reviewed.  Constitutional:       General: He is not in acute distress.    Appearance: He is well-developed and normal weight. He is not ill-appearing, toxic-appearing or diaphoretic.  HENT:     Head: Normocephalic and atraumatic.     Right Ear: Tympanic membrane and ear canal normal.     Left Ear: Tympanic membrane and ear canal normal.     Mouth/Throat:     Mouth: Mucous membranes are dry. No oral lesions.     Pharynx: Oropharynx is clear. No pharyngeal swelling, oropharyngeal exudate, posterior oropharyngeal erythema or uvula swelling.  Eyes:     Conjunctiva/sclera: Conjunctivae normal.     Pupils: Pupils are equal, round, and reactive to light.  Neck:     Thyroid: No thyromegaly.  Cardiovascular:     Rate and Rhythm: Normal rate and regular rhythm.  Pulmonary:     Effort: Pulmonary effort is normal.     Breath sounds: Normal breath sounds.  Lymphadenopathy:     Cervical: Cervical adenopathy (1 x 3 cm node on right non tender) present.  Skin:    General: Skin is warm and dry.     Findings: No rash.  Neurological:     Mental Status: He is alert and oriented to person, place, and time.  Psychiatric:        Mood and Affect: Mood normal.        Behavior: Behavior normal.     BP 134/70   Pulse 87   Temp (!) 97.1 F (36.2 C) (Temporal)   Ht 5\' 11"  (1.803 m)   Wt 174 lb 6.4 oz (79.1 kg)   SpO2 96%   BMI 24.32 kg/m  Wt Readings from Last 3 Encounters:  03/31/21 174 lb 6.4 oz (79.1 kg)  12/02/19 177 lb 6.4 oz (80.5 kg)  01/10/19 171 lb 8 oz (77.8 kg)     Health Maintenance Due  Topic Date Due  . Hepatitis C Screening  Never done  . COVID-19 Vaccine (1) 06/26/1992  . TETANUS/TDAP  Never done    There are no preventive care reminders to display for this patient.  Lab Results  Component Value Date   TSH 0.69 01/10/2019   Lab Results  Component Value Date   WBC 8.9 01/10/2019   HGB 15.1 01/10/2019   HCT 44.8 01/10/2019   MCV 88.3 01/10/2019   PLT 222.0 01/10/2019   Lab Results  Component  Value Date   NA 138 01/10/2019   K 4.1 01/10/2019   CO2 28 01/10/2019   GLUCOSE 84 01/10/2019   BUN 11 01/10/2019   CREATININE 1.05 01/10/2019   BILITOT 0.6 01/10/2019   ALKPHOS 63 01/10/2019   AST 22 01/10/2019   ALT 26 01/10/2019   PROT 7.7 01/10/2019   ALBUMIN 4.8 01/10/2019   CALCIUM 9.9 01/10/2019   GFR 78.82 01/10/2019   Lab  Results  Component Value Date   CHOL 190 01/10/2019   Lab Results  Component Value Date   HDL 42.30 01/10/2019   Lab Results  Component Value Date   LDLCALC 118 (H) 01/10/2019   Lab Results  Component Value Date   TRIG 149.0 01/10/2019   Lab Results  Component Value Date   CHOLHDL 4 01/10/2019   No results found for: HGBA1C    Assessment & Plan:   Problem List Items Addressed This Visit      Respiratory   Viral pharyngitis - Primary   Relevant Orders   CBC   Antistreptolysin O titer   Epstein-Barr virus VCA, IgM   CMV IgM   Covid-19 Antibodies IgM     Immune and Lymphatic   Cervical adenopathy      No orders of the defined types were placed in this encounter.   Follow-up: Return in about 1 month (around 04/30/2021), or if symptoms worsen or fail to improve.    Mliss Sax, MD

## 2021-03-31 NOTE — Addendum Note (Signed)
Addended by: Varney Biles on: 03/31/2021 10:03 AM   Modules accepted: Orders

## 2021-04-01 LAB — ANTISTREPTOLYSIN O TITER: ASO: 89 IU/mL (ref <200)

## 2021-04-01 LAB — EPSTEIN-BARR VIRUS VCA, IGM: EBV VCA IgM: <36 U/mL

## 2021-04-01 LAB — CMV IGM: CMV IgM: <30 AU/mL

## 2021-04-03 ENCOUNTER — Encounter: Payer: Self-pay | Admitting: Family Medicine

## 2021-04-19 ENCOUNTER — Encounter: Payer: Self-pay | Admitting: Family Medicine

## 2021-05-06 ENCOUNTER — Ambulatory Visit: Payer: 59 | Admitting: Family Medicine

## 2021-05-13 ENCOUNTER — Ambulatory Visit: Payer: 59 | Admitting: Family Medicine

## 2021-05-13 ENCOUNTER — Encounter: Payer: Self-pay | Admitting: Family Medicine

## 2021-05-13 ENCOUNTER — Other Ambulatory Visit: Payer: Self-pay

## 2021-05-13 VITALS — BP 115/74 | HR 80 | Temp 97.6°F | Ht 71.0 in | Wt 177.6 lb

## 2021-05-13 DIAGNOSIS — R0982 Postnasal drip: Secondary | ICD-10-CM

## 2021-05-13 DIAGNOSIS — H6991 Unspecified Eustachian tube disorder, right ear: Secondary | ICD-10-CM

## 2021-05-13 DIAGNOSIS — H6122 Impacted cerumen, left ear: Secondary | ICD-10-CM | POA: Diagnosis not present

## 2021-05-13 DIAGNOSIS — H6981 Other specified disorders of Eustachian tube, right ear: Secondary | ICD-10-CM

## 2021-05-13 DIAGNOSIS — R590 Localized enlarged lymph nodes: Secondary | ICD-10-CM

## 2021-05-13 MED ORDER — DEBROX 6.5 % OT SOLN: 5.0000 [drp] | Freq: Two times a day (BID) | OTIC | 2 refills | Status: DC

## 2021-05-13 MED ORDER — PREDNISONE 10 MG (21) PO TBPK: ORAL_TABLET | ORAL | 0 refills | Status: DC

## 2021-05-13 NOTE — Progress Notes (Signed)
Established Patient Office Visit  Subjective:  Patient ID: Tommy Ortega, male    DOB: [birth year]  Age: 40 y.o. MRN: [id number]  CC:  Chief Complaint  Patient presents with  . Follow-up    Follow up on swollen lymph nodes per patient still swollen come and go.     HPI Tommy Ortega presents for follow-up of his right cervical adenopathy.  Lymph node has not decreased in size.  Cannot say that it is been particularly tender.  Negative serological work-up.  He is having problems with his right ear.  There is some discomfort there.  Ear does feel stopped up.  He tells of ongoing postnasal drip.  He did use the Flonase as advised but only used it for couple of days and it did not seem to make any difference.  As far as he knows he does not grind his teeth.  His son does grind his teeth.  He does not have regular dental care.  Ongoing ceruminosis in the left ear.  He knows not to use Q-tips.  He has not tried earwax drops before.  There has been no weight loss or night sweats.  He does sometimes have a popping sound on the left side of his face when he opens his mouth.  Past Medical History:  Diagnosis Date  . Chicken pox   . GERD (gastroesophageal reflux disease)     Past Surgical History:  Procedure Laterality Date  . HAND SURGERY Left     Family History  Problem Relation Age of Onset  . Cancer Mother   . Arthritis Maternal Grandmother   . Cancer Maternal Grandmother   . Diabetes Maternal Grandmother   . Heart attack Maternal Grandfather     Social History   Socioeconomic History  . Marital status: Single    Spouse name: Not on file  . Number of children: Not on file  . Years of education: Not on file  . Highest education level: Not on file  Occupational History  . Not on file  Tobacco Use  . Smoking status: Current Every Day Smoker  . Smokeless tobacco: Current User    Types: Chew  Substance and Sexual Activity  . Alcohol use: Yes    Comment: rarely now   .  Drug use: Not Currently    Comment: ho heroin and opiate abuse, quit smoking marihuana this past June after his father's passing  . Sexual activity: Yes    Partners: Female  Other Topics Concern  . Not on file  Social History Narrative  . Not on file   Social Determinants of Health   Financial Resource Strain: Not on file  Food Insecurity: Not on file  Transportation Needs: Not on file  Physical Activity: Not on file  Stress: Not on file  Social Connections: Not on file  Intimate Partner Violence: Not on file    Outpatient Medications Prior to Visit  Medication Sig Dispense Refill  . ibuprofen (ADVIL) 800 MG tablet Take 1 tablet (800 mg total) by mouth 3 (three) times daily. 21 tablet 0  . Cetirizine HCl 10 MG CAPS Take 1 capsule (10 mg total) by mouth daily for 10 days. (Patient not taking: Reported on 03/31/2021) 10 capsule 0  . cyclobenzaprine (FLEXERIL) 10 MG tablet Take 1 tablet (10 mg total) by mouth 3 (three) times daily as needed for muscle spasms. (Patient not taking: Reported on 03/31/2021) 30 tablet 0   No facility-administered medications prior to visit.  No Known Allergies  ROS Review of Systems  Constitutional: Negative for chills, diaphoresis, fatigue, fever and unexpected weight change.  HENT: Positive for ear discharge, ear pain, hearing loss and postnasal drip. Negative for dental problem, sinus pressure and sinus pain.   Eyes: Negative for photophobia and visual disturbance.  Respiratory: Negative.   Cardiovascular: Negative.   Gastrointestinal: Negative.   Musculoskeletal: Negative for arthralgias.  Neurological: Negative for speech difficulty and weakness.  Psychiatric/Behavioral: Negative.       Objective:    Physical Exam Constitutional:      General: He is not in acute distress.    Appearance: Normal appearance. He is normal weight. He is not ill-appearing, toxic-appearing or diaphoretic.  HENT:     Head: Normocephalic and atraumatic.      Left Ear: There is impacted cerumen.     Mouth/Throat:     Mouth: Mucous membranes are moist.     Pharynx: Oropharynx is clear. No oropharyngeal exudate or posterior oropharyngeal erythema.  Eyes:     General: No scleral icterus.       Right eye: No discharge.        Left eye: No discharge.     Extraocular Movements: Extraocular movements intact.     Conjunctiva/sclera: Conjunctivae normal.     Pupils: Pupils are equal, round, and reactive to light.  Cardiovascular:     Rate and Rhythm: Normal rate and regular rhythm.  Pulmonary:     Effort: Pulmonary effort is normal.     Breath sounds: Normal breath sounds.  Abdominal:     General: Bowel sounds are normal.  Musculoskeletal:     Cervical back: No rigidity or tenderness.  Lymphadenopathy:     Cervical: Cervical adenopathy present.  Skin:    General: Skin is warm and dry.  Neurological:     Mental Status: He is alert and oriented to person, place, and time.  Psychiatric:        Mood and Affect: Mood normal.        Behavior: Behavior normal.     BP 115/74   Pulse 80   Temp 97.6 F (36.4 C) (Temporal)   Ht 5\' 11"  (1.803 m)   Wt 177 lb 9.6 oz (80.6 kg)   SpO2 98%   BMI 24.77 kg/m  Wt Readings from Last 3 Encounters:  05/13/21 177 lb 9.6 oz (80.6 kg)  03/31/21 174 lb 6.4 oz (79.1 kg)  12/02/19 177 lb 6.4 oz (80.5 kg)     Health Maintenance Due  Topic Date Due  . COVID-19 Vaccine (1) 06/26/1992  . Hepatitis C Screening  Never done  . TETANUS/TDAP  Never done    There are no preventive care reminders to display for this patient.  Lab Results  Component Value Date   TSH 0.69 01/10/2019   Lab Results  Component Value Date   WBC 7.3 03/31/2021   HGB 15.7 03/31/2021   HCT 47.7 03/31/2021   MCV 90.3 03/31/2021   PLT 164.0 03/31/2021   Lab Results  Component Value Date   NA 138 01/10/2019   K 4.1 01/10/2019   CO2 28 01/10/2019   GLUCOSE 84 01/10/2019   BUN 11 01/10/2019   CREATININE 1.05 01/10/2019    BILITOT 0.6 01/10/2019   ALKPHOS 63 01/10/2019   AST 22 01/10/2019   ALT 26 01/10/2019   PROT 7.7 01/10/2019   ALBUMIN 4.8 01/10/2019   CALCIUM 9.9 01/10/2019   GFR 78.82 01/10/2019   Lab Results  Component  Value Date   CHOL 190 01/10/2019   Lab Results  Component Value Date   HDL 42.30 01/10/2019   Lab Results  Component Value Date   LDLCALC 118 (H) 01/10/2019   Lab Results  Component Value Date   TRIG 149.0 01/10/2019   Lab Results  Component Value Date   CHOLHDL 4 01/10/2019   No results found for: HGBA1C    Assessment & Plan:   Problem List Items Addressed This Visit      Nervous and Auditory   Excessive cerumen in left ear canal   Relevant Medications   carbamide peroxide (DEBROX) 6.5 % OTIC solution     Immune and Lymphatic   Cervical adenopathy - Primary   Relevant Orders   Ambulatory referral to ENT    Other Visit Diagnoses    Dysfunction of right eustachian tube       Relevant Medications   predniSONE (STERAPRED UNI-PAK 21 TAB) 10 MG (21) TBPK tablet   Other Relevant Orders   Ambulatory referral to ENT   Post-nasal drip       Relevant Medications   predniSONE (STERAPRED UNI-PAK 21 TAB) 10 MG (21) TBPK tablet      Meds ordered this encounter  Medications  . predniSONE (STERAPRED UNI-PAK 21 TAB) 10 MG (21) TBPK tablet    Sig: Take 6 today, 5 tomorrow, 4 the next day and then 3, 2, 1 and stop    Dispense:  21 tablet    Refill:  0  . carbamide peroxide (DEBROX) 6.5 % OTIC solution    Sig: Place 5 drops into the left ear 2 (two) times daily.    Dispense:  15 mL    Refill:  2    Follow-up:   Please use the Flonase at least 1 month.  Go ahead and start the prednisone.  Use eardrops as directed for the left ear.  Believe that it is time for ENT to take a look at the right anterior cervical lymph node.  We will asked them to check the postnasal drip, eustachian tube dysfunction as well.  TMJ disease is a consideration.  Advised him to use  ibuprofen as needed for that. Mliss Sax, MD

## 2021-05-13 NOTE — Patient Instructions (Signed)
Earwax Buildup, Adult The ears produce a substance called earwax that helps keep bacteria out of the ear and protects the skin in the ear canal. Occasionally, earwax can build up in the ear and cause discomfort or hearing loss. What are the causes? This condition is caused by a buildup of earwax. Ear canals are self-cleaning. Ear wax is made in the outer part of the ear canal and generally falls out in small amounts over time. When the self-cleaning mechanism is not working, earwax builds up and can cause decreased hearing and discomfort. Attempting to clean ears with cotton swabs can push the earwax deep into the ear canal and cause decreased hearing and pain. What increases the risk? This condition is more likely to develop in people who:  Clean their ears often with cotton swabs.  Pick at their ears.  Use earplugs or in-ear headphones often, or wear hearing aids. The following factors may also make you more likely to develop this condition:  Being male.  Being of older age.  Naturally producing more earwax.  Having narrow ear canals.  Having earwax that is overly thick or sticky.  Having excess hair in the ear canal.  Having eczema.  Being dehydrated. What are the signs or symptoms? Symptoms of this condition include:  Reduced or muffled hearing.  A feeling of fullness in the ear or feeling that the ear is plugged.  Fluid coming from the ear.  Ear pain or an itchy ear.  Ringing in the ear.  Coughing.  Balance problems.  An obvious piece of earwax that can be seen inside the ear canal. How is this diagnosed? This condition may be diagnosed based on:  Your symptoms.  Your medical history.  An ear exam. During the exam, your health care provider will look into your ear with an instrument called an otoscope. You may have tests, including a hearing test. How is this treated? This condition may be treated by:  Using ear drops to soften the earwax.  Having  the earwax removed by a health care provider. The health care provider may: ? Flush the ear with water. ? Use an instrument that has a loop on the end (curette). ? Use a suction device.  Having surgery to remove the wax buildup. This may be done in severe cases. Follow these instructions at home:  Take over-the-counter and prescription medicines only as told by your health care provider.  Do not put any objects, including cotton swabs, into your ear. You can clean the opening of your ear canal with a washcloth or facial tissue.  Follow instructions from your health care provider about cleaning your ears. Do not overclean your ears.  Drink enough fluid to keep your urine pale yellow. This will help to thin the earwax.  Keep all follow-up visits as told. If earwax builds up in your ears often or if you use hearing aids, consider seeing your health care provider for routine, preventive ear cleanings. Ask your health care provider how often you should schedule your cleanings.  If you have hearing aids, clean them according to instructions from the manufacturer and your health care provider.   Contact a health care provider if:  You have ear pain.  You develop a fever.  You have pus or other fluid coming from your ear.  You have hearing loss.  You have ringing in your ears that does not go away.  You feel like the room is spinning (vertigo).  Your symptoms do not   improve with treatment. Get help right away if:  You have bleeding from the affected ear.  You have severe ear pain. Summary  Earwax can build up in the ear and cause discomfort or hearing loss.  The most common symptoms of this condition include reduced or muffled hearing, a feeling of fullness in the ear, or feeling that the ear is plugged.  This condition may be diagnosed based on your symptoms, your medical history, and an ear exam.  This condition may be treated by using ear drops to soften the earwax or by  having the earwax removed by a health care provider.  Do not put any objects, including cotton swabs, into your ear. You can clean the opening of your ear canal with a washcloth or facial tissue. This information is not intended to replace advice given to you by your health care provider. Make sure you discuss any questions you have with your health care provider. Document Revised: 03/23/2020 Document Reviewed: 03/23/2020 Elsevier Patient Education  2021 [employer] Inc.  Eustachian Tube Dysfunction  Eustachian tube dysfunction refers to a condition in which a blockage develops in the narrow passage that connects the middle ear to the back of the nose (eustachian tube). The eustachian tube regulates air pressure in the middle ear by letting air move between the ear and nose. It also helps to drain fluid from the middle ear space. Eustachian tube dysfunction can affect one or both ears. When the eustachian tube does not function properly, air pressure, fluid, or both can build up in the middle ear. What are the causes? This condition occurs when the eustachian tube becomes blocked or cannot open normally. Common causes of this condition include:  Ear infections.  Colds and other infections that affect the nose, mouth, and throat (upper respiratory tract).  Allergies.  Irritation from cigarette smoke.  Irritation from stomach acid coming up into the esophagus (gastroesophageal reflux). The esophagus is the tube that carries food from the mouth to the stomach.  Sudden changes in air pressure, such as from descending in an airplane or scuba diving.  Abnormal growths in the nose or throat, such as: ? Growths that line the nose (nasal polyps). ? Abnormal growth of cells (tumors). ? Enlarged tissue at the back of the throat (adenoids). What increases the risk? You are more likely to develop this condition if:  You smoke.  You are overweight.  You are a child who has: ? Certain birth  defects of the mouth, such as cleft palate. ? Large tonsils or adenoids. What are the signs or symptoms? Common symptoms of this condition include:  A feeling of fullness in the ear.  Ear pain.  Clicking or popping noises in the ear.  Ringing in the ear.  Hearing loss.  Loss of balance.  Dizziness. Symptoms may get worse when the air pressure around you changes, such as when you travel to an area of high elevation, fly on an airplane, or go scuba diving. How is this diagnosed? This condition may be diagnosed based on:  Your symptoms.  A physical exam of your ears, nose, and throat.  Tests, such as those that measure: ? The movement of your eardrum (tympanogram). ? Your hearing (audiometry). How is this treated? Treatment depends on the cause and severity of your condition.  In mild cases, you may relieve your symptoms by moving air into your ears. This is called "popping the ears."  In more severe cases, or if you have symptoms of  fluid in your ears, treatment may include: ? Medicines to relieve congestion (decongestants). ? Medicines that treat allergies (antihistamines). ? Nasal sprays or ear drops that contain medicines that reduce swelling (steroids). ? A procedure to drain the fluid in your eardrum (myringotomy). In this procedure, a small tube is placed in the eardrum to:  Drain the fluid.  Restore the air in the middle ear space. ? A procedure to insert a balloon device through the nose to inflate the opening of the eustachian tube (balloon dilation). Follow these instructions at home: Lifestyle  Do not do any of the following until your health care provider approves: ? Travel to high altitudes. ? Fly in airplanes. ? Work in a Estate agent or room. ? Scuba dive.  Do not use any products that contain nicotine or tobacco, such as cigarettes and e-cigarettes. If you need help quitting, ask your health care provider.  Keep your ears dry. Wear fitted  earplugs during showering and bathing. Dry your ears completely after. General instructions  Take over-the-counter and prescription medicines only as told by your health care provider.  Use techniques to help pop your ears as recommended by your health care provider. These may include: ? Chewing gum. ? Yawning. ? Frequent, forceful swallowing. ? Closing your mouth, holding your nose closed, and gently blowing as if you are trying to blow air out of your nose.  Keep all follow-up visits as told by your health care provider. This is important. Contact a health care provider if:  Your symptoms do not go away after treatment.  Your symptoms come back after treatment.  You are unable to pop your ears.  You have: ? A fever. ? Pain in your ear. ? Pain in your head or neck. ? Fluid draining from your ear.  Your hearing suddenly changes.  You become very dizzy.  You lose your balance. Summary  Eustachian tube dysfunction refers to a condition in which a blockage develops in the eustachian tube.  It can be caused by ear infections, allergies, inhaled irritants, or abnormal growths in the nose or throat.  Symptoms include ear pain, hearing loss, or ringing in the ears.  Mild cases are treated with maneuvers to unblock the ears, such as yawning or ear popping.  Severe cases are treated with medicines. Surgery may also be done (rare). This information is not intended to replace advice given to you by your health care provider. Make sure you discuss any questions you have with your health care provider. Document Revised: 03/26/2018 Document Reviewed: 03/26/2018 Elsevier Patient Education  2021 [employer].

## 2021-05-31 ENCOUNTER — Encounter: Payer: Self-pay | Admitting: Family Medicine

## 2021-05-31 ENCOUNTER — Other Ambulatory Visit: Payer: Self-pay

## 2021-05-31 ENCOUNTER — Other Ambulatory Visit (HOSPITAL_COMMUNITY)
Admission: RE | Admit: 2021-05-31 | Discharge: 2021-05-31 | Disposition: A | Discharge status: Home / Self Care | Payer: 59 | Source: Ambulatory Visit | Attending: Family Medicine | Admitting: Family Medicine

## 2021-05-31 ENCOUNTER — Ambulatory Visit: Payer: 59 | Admitting: Family Medicine

## 2021-05-31 VITALS — BP 124/78 | HR 78 | Temp 97.9°F | Ht 71.0 in | Wt 176.0 lb

## 2021-05-31 DIAGNOSIS — R7989 Other specified abnormal findings of blood chemistry: Secondary | ICD-10-CM

## 2021-05-31 DIAGNOSIS — Z7721 Contact with and (suspected) exposure to potentially hazardous body fluids: Secondary | ICD-10-CM

## 2021-05-31 DIAGNOSIS — F418 Other specified anxiety disorders: Secondary | ICD-10-CM | POA: Diagnosis not present

## 2021-05-31 DIAGNOSIS — H6122 Impacted cerumen, left ear: Secondary | ICD-10-CM

## 2021-05-31 DIAGNOSIS — Z8349 Family history of other endocrine, nutritional and metabolic diseases: Secondary | ICD-10-CM | POA: Diagnosis not present

## 2021-05-31 DIAGNOSIS — K4090 Unilateral inguinal hernia, without obstruction or gangrene, not specified as recurrent: Secondary | ICD-10-CM | POA: Insufficient documentation

## 2021-05-31 DIAGNOSIS — Z0000 Encounter for general adult medical examination without abnormal findings: Secondary | ICD-10-CM

## 2021-05-31 LAB — CBC
HCT: 47.7 % (ref 39.0–52.0)
Hemoglobin: 16.1 g/dL (ref 13.0–17.0)
MCHC: 33.7 g/dL (ref 30.0–36.0)
MCV: 91.6 fl (ref 78.0–100.0)
Platelets: 188 10*3/uL (ref 150.0–400.0)
RBC: 5.21 Mil/uL (ref 4.22–5.81)
RDW: 13.9 % (ref 11.5–15.5)
WBC: 8 10*3/uL (ref 4.0–10.5)

## 2021-05-31 LAB — LIPID PANEL
Cholesterol: 236 mg/dL — ABNORMAL HIGH (ref 0–200)
HDL: 51.7 mg/dL (ref >39.00)
LDL Cholesterol: 159 mg/dL — ABNORMAL HIGH (ref 0–99)
NonHDL: 184.35
Total CHOL/HDL Ratio: 5
Triglycerides: 127 mg/dL (ref 0.0–149.0)
VLDL: 25.4 mg/dL (ref 0.0–40.0)

## 2021-05-31 LAB — COMPREHENSIVE METABOLIC PANEL
ALT: 45 U/L (ref 0–53)
AST: 27 U/L (ref 0–37)
Albumin: 4.7 g/dL (ref 3.5–5.2)
Alkaline Phosphatase: 69 U/L (ref 39–117)
BUN: 16 mg/dL (ref 6–23)
CO2: 25 mEq/L (ref 19–32)
Calcium: 9.5 mg/dL (ref 8.4–10.5)
Chloride: 104 mEq/L (ref 96–112)
Creatinine, Ser: 1.05 mg/dL (ref 0.40–1.50)
GFR: 88.53 mL/min (ref >60.00)
Glucose, Bld: 99 mg/dL (ref 70–99)
Potassium: 4.5 mEq/L (ref 3.5–5.1)
Sodium: 138 mEq/L (ref 135–145)
Total Bilirubin: 0.8 mg/dL (ref 0.2–1.2)
Total Protein: 7.5 g/dL (ref 6.0–8.3)

## 2021-05-31 LAB — URINALYSIS, ROUTINE W REFLEX MICROSCOPIC
Bilirubin Urine: NEGATIVE
Hgb urine dipstick: NEGATIVE
Ketones, ur: NEGATIVE
Leukocytes,Ua: NEGATIVE
Nitrite: NEGATIVE
RBC / HPF: NONE SEEN (ref 0–?)
Specific Gravity, Urine: 1.02 (ref 1.000–1.030)
Total Protein, Urine: NEGATIVE
Urine Glucose: NEGATIVE
Urobilinogen, UA: 0.2 (ref 0.0–1.0)
pH: 6 (ref 5.0–8.0)

## 2021-05-31 LAB — TSH: TSH: 1.38 u[IU]/mL (ref 0.35–4.50)

## 2021-05-31 LAB — T3, FREE: T3, Free: 3.4 pg/mL (ref 2.3–4.2)

## 2021-05-31 MED ORDER — DEBROX 6.5 % OT SOLN: 5.0000 [drp] | Freq: Two times a day (BID) | OTIC | 0 refills | Status: DC

## 2021-05-31 NOTE — Progress Notes (Signed)
Established Patient Office Visit  Subjective:  Patient ID: Tommy Ortega, male    DOB: [birth year]  Age: 40 y.o. MRN: [id number]  CC:  Chief Complaint  Patient presents with   Exposure to STD    Patient would like blood work check TSH and STD testing, No exposure patient fasting for labs.     HPI [name] presents for a physical exam and follow-up of some issues that he has been having.  He still feels a fullness in the right side of his neck.  ENT consultation is soon.  Mom has what sounds like hyperthyroidism and he would like to have his thyroid function checked.  It was checked 2 years ago normal.  He consumes 2 alcoholic drinks of liquor nightly.  He does not currently measuring them.  He admits to feeling anxious and sad.  He is against treatment at this time.  He would like to be checked for STDs.  Denies discharge or unusual rash on his penis.  Occasionally he feels a slight burning with urination.  Did not obtain eardrops for his ceruminosis.  Past Medical History:  Diagnosis Date   Chicken pox    GERD (gastroesophageal reflux disease)     Past Surgical History:  Procedure Laterality Date   HAND SURGERY Left     Family History  Problem Relation Age of Onset   Cancer Mother    Arthritis Maternal Grandmother    Cancer Maternal Grandmother    Diabetes Maternal Grandmother    Heart attack Maternal Grandfather     Social History   Socioeconomic History   Marital status: Single    Spouse name: Not on file   Number of children: Not on file   Years of education: Not on file   Highest education level: Not on file  Occupational History   Not on file  Tobacco Use   Smoking status: Every Day    Pack years: 0.00   Smokeless tobacco: Current    Types: Chew  Substance and Sexual Activity   Alcohol use: Yes    Comment: rarely now    Drug use: Not Currently    Comment: ho heroin and opiate abuse, quit smoking marihuana this past June after his father's  passing   Sexual activity: Yes    Partners: Female  Other Topics Concern   Not on file  Social History Narrative   Not on file   Social Determinants of Health   Financial Resource Strain: Not on file  Food Insecurity: Not on file  Transportation Needs: Not on file  Physical Activity: Not on file  Stress: Not on file  Social Connections: Not on file  Intimate Partner Violence: Not on file    Outpatient Medications Prior to Visit  Medication Sig Dispense Refill   ibuprofen (ADVIL) 800 MG tablet Take 1 tablet (800 mg total) by mouth 3 (three) times daily. 21 tablet 0   carbamide peroxide (DEBROX) 6.5 % OTIC solution Place 5 drops into the left ear 2 (two) times daily. (Patient not taking: Reported on 05/31/2021) 15 mL 2   predniSONE (STERAPRED UNI-PAK 21 TAB) 10 MG (21) TBPK tablet Take 6 today, 5 tomorrow, 4 the next day and then 3, 2, 1 and stop (Patient not taking: Reported on 05/31/2021) 21 tablet 0   No facility-administered medications prior to visit.    No Known Allergies  ROS Review of Systems  Constitutional: Negative.   HENT: Negative.    Eyes:  Negative  for photophobia and visual disturbance.  Respiratory: Negative.    Cardiovascular: Negative.   Gastrointestinal: Negative.   Endocrine: Negative for polyphagia and polyuria.  Genitourinary:  Negative for difficulty urinating, frequency, genital sores, penile discharge and urgency.  Musculoskeletal: Negative.   Skin: Negative.   Neurological: Negative.   Dep[facility] screen [facility] 05/31/2021 05/31/2021 05/13/2021  Decreased Interest 1 0 0  Down, Depressed, Hopeless 1 0 0  PHQ - 2 Score 2 0 0  Altered sleeping 2 - -  Tired, decreased energy 1 - -  Change in appetite 0 - -  Feeling bad or failure about yourself  0 - -  Trouble concentrating 0 - -  Moving slowly or fidgety/restless 0 - -  Suicidal thoughts 0 - -  PHQ-9 Score 5 - -  Difficult doing work/chores Somewhat difficult - -       Objective:     Physical Exam Vitals and nursing note reviewed.  Constitutional:      General: He is not in acute distress.    Appearance: Normal appearance. He is normal weight. He is not ill-appearing, toxic-appearing or diaphoretic.  HENT:     Head: Normocephalic and atraumatic.     Right Ear: Tympanic membrane, ear canal and external ear normal.     Left Ear: There is impacted cerumen.     Mouth/Throat:     Mouth: Mucous membranes are dry.     Pharynx: Oropharynx is clear.  Eyes:     General: No scleral icterus.       Right eye: No discharge.        Left eye: No discharge.     Extraocular Movements: Extraocular movements intact.     Conjunctiva/sclera: Conjunctivae normal.     Pupils: Pupils are equal, round, and reactive to light.  Neck:     Thyroid: No thyroid mass, thyromegaly or thyroid tenderness.     Vascular: No carotid bruit.  Cardiovascular:     Rate and Rhythm: Normal rate and regular rhythm.     Pulses: Normal pulses.     Heart sounds: Normal heart sounds.  Pulmonary:     Effort: Pulmonary effort is normal.     Breath sounds: Normal breath sounds.  Abdominal:     General: Bowel sounds are normal.     Hernia: A hernia is present. Hernia is present in the left inguinal area. There is no hernia in the right inguinal area.  Genitourinary:    Penis: Circumcised. No hypospadias, erythema, tenderness, discharge, swelling or lesions.      Testes:        Right: Mass, tenderness or swelling not present. Right testis is descended.        Left: Mass, tenderness or swelling not present. Left testis is descended.  Musculoskeletal:     Cervical back: No rigidity or tenderness.  Lymphadenopathy:     Cervical: No cervical adenopathy.     Lower Body: No right inguinal adenopathy. No left inguinal adenopathy.  Skin:    General: Skin is warm and dry.  Neurological:     Mental Status: He is alert and oriented to person, place, and time.  Psychiatric:        Mood and Affect: Mood normal.         Behavior: Behavior normal.    BP 124/78   Pulse 78   Temp 97.9 F (36.6 C) (Temporal)   Ht 5\' 11"  (1.803 m)   Wt 176 lb (79.8 kg)   SpO2  98%   BMI 24.55 kg/m  Wt Readings from Last 3 Encounters:  05/31/21 176 lb (79.8 kg)  05/13/21 177 lb 9.6 oz (80.6 kg)  03/31/21 174 lb 6.4 oz (79.1 kg)     Health Maintenance Due  Topic Date Due   Pneumococcal Vaccine 62-32 Years old (1 - PCV) Never done   Hepatitis C Screening  Never done   TETANUS/TDAP  Never done    There are no preventive care reminders to display for this patient.  Lab Results  Component Value Date   TSH 0.69 01/10/2019   Lab Results  Component Value Date   WBC 7.3 03/31/2021   HGB 15.7 03/31/2021   HCT 47.7 03/31/2021   MCV 90.3 03/31/2021   PLT 164.0 03/31/2021   Lab Results  Component Value Date   NA 138 01/10/2019   K 4.1 01/10/2019   CO2 28 01/10/2019   GLUCOSE 84 01/10/2019   BUN 11 01/10/2019   CREATININE 1.05 01/10/2019   BILITOT 0.6 01/10/2019   ALKPHOS 63 01/10/2019   AST 22 01/10/2019   ALT 26 01/10/2019   PROT 7.7 01/10/2019   ALBUMIN 4.8 01/10/2019   CALCIUM 9.9 01/10/2019   GFR 78.82 01/10/2019   Lab Results  Component Value Date   CHOL 190 01/10/2019   Lab Results  Component Value Date   HDL 42.30 01/10/2019   Lab Results  Component Value Date   LDLCALC 118 (H) 01/10/2019   Lab Results  Component Value Date   TRIG 149.0 01/10/2019   Lab Results  Component Value Date   CHOLHDL 4 01/10/2019   No results found for: HGBA1C    Assessment & Plan:   Problem List Items Addressed This Visit       Nervous and Auditory   Excessive cerumen in left ear canal - Primary   Relevant Medications   carbamide peroxide (DEBROX) 6.5 % OTIC solution     Other   Healthcare maintenance   Relevant Orders   CBC   Comprehensive metabolic panel   Lipid panel   Anxiety with depression   Unilateral inguinal hernia without obstruction or gangrene   Relevant Orders    Ambulatory referral to General Surgery   Family history of thyroid disease in mother   Exposure to blood or body fluid   Relevant Orders   Urinalysis, Routine w reflex microscopic   Urine cytology ancillary only   HIV Antibody (routine testing w rflx)   Low TSH level   Relevant Orders   TSH   T3, free    Meds ordered this encounter  Medications   carbamide peroxide (DEBROX) 6.5 % OTIC solution    Sig: Place 5 drops into the left ear 2 (two) times daily.    Dispense:  15 mL    Refill:  0    Follow-up: Return in about 3 months (around 08/31/2021), or if symptoms worsen or fail to improve.   Given information on health maintenance and disease prevention.  Also information was given on managing anxiety and earwax buildup.  Encouraged him to go ahead and purchase the ear wax drops and use them.  He has follow-up scheduled with ENT soon to check the fullness that he feels in his neck on the right side.  Checking TSH today with respect to what sounds like his mother's history of hyperthyroidism.  He is planning on pursuing counseling at [facility].  Apparently his mother is employed there and can help him gain access to a counselor  at that school.  Patient declines medicines at this time.  Reminded him that a serving of liquor is 2 ounces and to please measure it when he consumes it. Mliss Sax, MD

## 2021-06-01 LAB — URINE CYTOLOGY ANCILLARY ONLY
Chlamydia: NEGATIVE
Comment: NEGATIVE
Comment: NORMAL
Neisseria Gonorrhea: NEGATIVE

## 2021-06-01 LAB — HIV ANTIBODY (ROUTINE TESTING W REFLEX): HIV 1&2 Ab, 4th Generation: NONREACTIVE

## 2021-06-05 NOTE — Progress Notes (Signed)
Recheck of thyroid finds labs normal.  Ldl or bad cholesterol is elevated. Please lower fat and cholesterol in diet. We will follow.

## 2021-06-28 ENCOUNTER — Ambulatory Visit (INDEPENDENT_AMBULATORY_CARE_PROVIDER_SITE_OTHER): Payer: 59 | Admitting: Otolaryngology

## 2021-08-18 LAB — COVID-19 ANTIBODIES IGM
Result: NEGATIVE
Value: 0.12 COI

## 2022-04-03 ENCOUNTER — Ambulatory Visit: Payer: 59 | Admitting: Family Medicine

## 2022-04-03 ENCOUNTER — Telehealth: Payer: Self-pay | Admitting: Family Medicine

## 2022-04-03 NOTE — Telephone Encounter (Signed)
Patient/Caregiver was notified of No Show/Late Cancellation Policy & possible $50 charge. ?Visit was cancelled with reason "No Show/Cancel within 24 hours" for tracking & charging. ? ?Caller Name: Arshia Mannor ?Caller Ph #: [phone] ?Date of APPT: 04/03/22 ?Reason given for no show/late cancellation: work conflict ?No Show Letter printed & put in outgoing mail (Yes/No): yes ? ?~~~Route message to admin supervisor and clinical team/CMA~~~ ? ? ? ?

## 2022-04-13 NOTE — Telephone Encounter (Signed)
1st no show, no fee ?

## 2022-05-11 ENCOUNTER — Other Ambulatory Visit: Payer: Self-pay

## 2022-05-11 DIAGNOSIS — H6122 Impacted cerumen, left ear: Secondary | ICD-10-CM

## 2022-05-11 NOTE — Progress Notes (Signed)
error 

## 2022-05-12 ENCOUNTER — Ambulatory Visit: Payer: 59 | Admitting: Gastroenterology

## 2022-05-12 ENCOUNTER — Encounter: Payer: Self-pay | Admitting: Gastroenterology

## 2022-05-12 VITALS — BP 120/70 | HR 103 | Ht 72.0 in | Wt 171.0 lb

## 2022-05-12 DIAGNOSIS — K219 Gastro-esophageal reflux disease without esophagitis: Secondary | ICD-10-CM | POA: Diagnosis not present

## 2022-05-12 DIAGNOSIS — R6881 Early satiety: Secondary | ICD-10-CM

## 2022-05-12 DIAGNOSIS — R112 Nausea with vomiting, unspecified: Secondary | ICD-10-CM

## 2022-05-12 MED ORDER — OMEPRAZOLE 40 MG PO CPDR: DELAYED_RELEASE_CAPSULE | ORAL | 3 refills | Status: AC

## 2022-05-12 NOTE — Patient Instructions (Addendum)
If you are age 42 or older, your body mass index should be between 23-30. Your Body mass index is 23.19 kg/m. If this is out of the aforementioned range listed, please consider follow up with your Primary Care Provider.  If you are age 29 or younger, your body mass index should be between 19-25. Your Body mass index is 23.19 kg/m. If this is out of the aformentioned range listed, please consider follow up with your Primary Care Provider.   ________________________________________________________  The [facility] providers would like to encourage you to use [facility] to communicate with providers for non-urgent requests or questions.  Due to long hold times on the telephone, sending your provider a message by [facility] may be a faster and more efficient way to get a response.  Please allow 48 business hours for a response.  Please remember that this is for non-urgent requests.  _______________________________________________________  We have sent the following medications to your pharmacy for you to pick up at your convenience: Omeprazole 40 [id number]:GMWN once daily 30 minutes before a meal  You have been scheduled for an endoscopy. Please follow written instructions given to you at your visit today. If you use inhalers (even only as needed), please bring them with you on the day of your procedure.  Thank you for entrusting me with your care and for choosing [facility], Dr. Ileene Patrick

## 2022-05-12 NOTE — Progress Notes (Signed)
HPI :  41 year old male with a history of GERD, alcohol use, substance abuse, referred here by Tommy Rubins, MD for reflux.  Patient states he has had GERD for a "long time".  He endorses a history of relatively heavy alcohol use between 2020 and 2022, but could drink upwards of 1/5 of liquor over 2-day period of time.  He states during this time he had severe reflux that was bothering him, feelings of fullness in his stomach and poor eating.  He thought the alcohol was causing his symptoms, he has been sober from alcohol since 2023, completely stopped drinking due to this.Tommy Ortega  Despite stopping alcohol his symptoms have persisted however.  He states he has early satiety, has a hard time eating a full meal as he will feel full easily, has a lot of belching and regurgitation of food.  Sometimes symptoms are so bad he vomits, occasionally intentionally, to relieve his abdominal discomfort.  He feels nauseated with this when it happens.  He feels that some days he tolerates food well and feels okay, some days he feels quite poorly and has a hard time eating much of anything.  He states on average about more than 50% of the days of the week he will feel poorly with the symptoms.  He denies any alcohol use again since January 1.  He used to smoke tobacco but does not any longer, he does vape.  He has a history of heroin and other substance abuse several years ago in the past but denies any of that recently.  At baseline he denies any problems with dysphagia when he eats.  He denies any abdominal pains.  He denies any NSAID use routinely.  He has used some Tums and Pepto-Bismol at times but it does not seem to help much.  He has tried a few doses of omeprazole and isolated occurrence but he is states hard to tell if it helped at the time.  He had some loose stools while drinking alcohol routinely but reports they have formed up and he has soft stools now roughly once per day.  No blood in his stools.  He has  never had a prior endoscopy.  Currently not really taking any medications at all.  Despite not eating too well he thinks his weight has been pretty stable.    Past Medical History:  Diagnosis Date   Chicken pox    GERD (gastroesophageal reflux disease)      Past Surgical History:  Procedure Laterality Date   HAND SURGERY Left    Family History  Problem Relation Age of Onset   Cancer Mother    Arthritis Maternal Grandmother    Cancer Maternal Grandmother    Diabetes Maternal Grandmother    Heart attack Maternal Grandfather    Social History   Tobacco Use   Smoking status: Every Day   Smokeless tobacco: Current    Types: Chew  Substance Use Topics   Alcohol use: Yes    Comment: rarely now    Drug use: Not Currently    Comment: ho heroin and opiate abuse, quit smoking marihuana this past June after his father's passing   No current outpatient medications on file.   No current facility-administered medications for this visit.   No Known Allergies   Review of Systems: All systems reviewed and negative except where noted in HPI.   Lab Results  Component Value Date   WBC 8.0 05/31/2021   HGB 16.1 05/31/2021   HCT  47.7 05/31/2021   MCV 91.6 05/31/2021   PLT 188.0 05/31/2021    Lab Results  Component Value Date   CREATININE 1.05 05/31/2021   BUN 16 05/31/2021   NA 138 05/31/2021   K 4.5 05/31/2021   CL 104 05/31/2021   CO2 25 05/31/2021    Lab Results  Component Value Date   ALT 45 05/31/2021   AST 27 05/31/2021   ALKPHOS 69 05/31/2021   BILITOT 0.8 05/31/2021      Physical Exam: BP 120/70   Pulse (!) 103   Ht 6' (1.829 m)   Wt 171 lb (77.6 kg)   BMI 23.19 kg/m  Constitutional: Pleasant,well-developed, male in no acute distress. HEENT: Normocephalic and atraumatic. Conjunctivae are normal. No scleral icterus. Neck supple.  Cardiovascular: Normal rate, regular rhythm.  Pulmonary/chest: Effort normal and breath sounds normal.  Abdominal:  Soft, nondistended, nontender. There are no masses palpable.  Extremities: no edema Lymphadenopathy: No cervical adenopathy noted. Neurological: Alert and oriented to person place and time. Skin: Skin is warm and dry. No rashes noted. Psychiatric: Normal mood and affect. Behavior is normal.   ASSESSMENT AND PLAN: 41 year old male here for new patient assessment for the following:  GERD Early satiety Vomiting  As above, patient with longstanding reflux symptoms, he thought or worsen due to alcohol use but stopped drinking in continues to have significant upper tract symptoms that bother him to include reflux, early satiety, occasional vomiting.  This appears to be bothering him on most days of the week, no prior evaluation.  Discussed options with him.  I am recommending an upper endoscopy to further evaluate, assess for large hiatal hernia, make sure no evidence of PUD or gastric outlet obstruction.  Discussed risks and benefits of endoscopy and anesthesia with him, he wants to proceed.  In the interim recommend he take PPI daily to see if this will help him (he has only previously taken isolated doses a few times).  Recommend omeprazole 40 mg once daily until his endoscopy, which will hopefully be done in the next week or 2.  Further recommendations pending the results.  If any worsening in the interim he will let me know.  Harlin Rain, MD [facility]  CC: Mliss Sax,*

## 2022-05-22 ENCOUNTER — Ambulatory Visit (AMBULATORY_SURGERY_CENTER): Payer: 59 | Admitting: Gastroenterology

## 2022-05-22 ENCOUNTER — Encounter: Payer: Self-pay | Admitting: Gastroenterology

## 2022-05-22 VITALS — BP 132/68 | HR 71 | Temp 98.0°F | Resp 12 | Ht 72.0 in | Wt 171.0 lb

## 2022-05-22 DIAGNOSIS — K219 Gastro-esophageal reflux disease without esophagitis: Secondary | ICD-10-CM

## 2022-05-22 DIAGNOSIS — R6881 Early satiety: Secondary | ICD-10-CM

## 2022-05-22 DIAGNOSIS — K449 Diaphragmatic hernia without obstruction or gangrene: Secondary | ICD-10-CM | POA: Diagnosis not present

## 2022-05-22 DIAGNOSIS — R112 Nausea with vomiting, unspecified: Secondary | ICD-10-CM

## 2022-05-22 DIAGNOSIS — K2980 Duodenitis without bleeding: Secondary | ICD-10-CM | POA: Diagnosis not present

## 2022-05-22 DIAGNOSIS — K3189 Other diseases of stomach and duodenum: Secondary | ICD-10-CM | POA: Diagnosis not present

## 2022-05-22 MED ORDER — SODIUM CHLORIDE 0.9 % IV SOLN: 500.0000 mL | Freq: Once | INTRAVENOUS | Status: DC

## 2022-05-22 NOTE — Patient Instructions (Addendum)
Please read handouts provided. Continue present medications. Await pathology results. Continue trial of omeprazole 40 mg everyday for 4 weeks.   YOU HAD AN ENDOSCOPIC PROCEDURE TODAY AT THE [facility]:   Refer to the procedure report that was given to you for any specific questions about what was found during the examination.  If the procedure report does not answer your questions, please call your gastroenterologist to clarify.  If you requested that your care partner not be given the details of your procedure findings, then the procedure report has been included in a sealed envelope for you to review at your convenience later.  YOU SHOULD EXPECT: Some feelings of bloating in the abdomen. Passage of more gas than usual.  Walking can help get rid of the air that was put into your GI tract during the procedure and reduce the bloating. If you had a lower endoscopy (such as a colonoscopy or flexible sigmoidoscopy) you may notice spotting of blood in your stool or on the toilet paper. If you underwent a bowel prep for your procedure, you may not have a normal bowel movement for a few days.  Please Note:  You might notice some irritation and congestion in your nose or some drainage.  This is from the oxygen used during your procedure.  There is no need for concern and it should clear up in a day or so.  SYMPTOMS TO REPORT IMMEDIATELY:    Following upper endoscopy (EGD)  Vomiting of blood or coffee ground material  New chest pain or pain under the shoulder blades  Painful or persistently difficult swallowing  New shortness of breath  Fever of 100F or higher  Black, tarry-looking stools  For urgent or emergent issues, a gastroenterologist can be reached at any hour by calling [phone]. Do not use MyChart messaging for urgent concerns.    DIET:  We do recommend a small meal at first, but then you may proceed to your regular diet.  Drink plenty of fluids but you should avoid  alcoholic beverages for 24 hours.  ACTIVITY:  You should plan to take it easy for the rest of today and you should NOT DRIVE or use heavy machinery until tomorrow (because of the sedation medicines used during the test).    FOLLOW UP: Our staff will call the number listed on your records 24-72 hours following your procedure to check on you and address any questions or concerns that you may have regarding the information given to you following your procedure. If we do not reach you, we will leave a message.  We will attempt to reach you two times.  During this call, we will ask if you have developed any symptoms of COVID 19. If you develop any symptoms (ie: fever, flu-like symptoms, shortness of breath, cough etc.) before then, please call [phone].  If you test positive for Covid 19 in the 2 weeks post procedure, please call and report this information to [place].    If any biopsies were taken you will be contacted by phone or by letter within the next 1-3 weeks.  Please call us at [phone] if you have not heard about the biopsies in 3 weeks.    SIGNATURES/CONFIDENTIALITY: You and/or your care partner have signed paperwork which will be entered into your electronic medical record.  These signatures attest to the fact that that the information above on your After Visit Summary has been reviewed and is understood.  Full responsibility of the confidentiality of  this discharge information lies with you and/or your care-partner.

## 2022-05-22 NOTE — Progress Notes (Signed)
Called to room to assist during endoscopic procedure.  Patient ID and intended procedure confirmed with present staff. Received instructions for my participation in the procedure from the performing physician.  

## 2022-05-22 NOTE — Progress Notes (Signed)
To pacu, VSS. Report to [email or web address] 

## 2022-05-22 NOTE — Progress Notes (Signed)
Pt's states no medical or surgical changes since previsit or office visit. 

## 2022-05-22 NOTE — Op Note (Signed)
[facility] Patient Name: Tommy Ortega Procedure Date: 05/22/2022 1:50 PM MRN: [id number] Endoscopist: Remo Lipps P. Havery Moros , MD Age: 41 Referring MD:  Date of Birth: 11-12-1981 Gender: Male Account #: [id number] Procedure:                Upper GI endoscopy Indications:              Follow-up of gastro-esophageal reflux disease,                            Early satiety, Vomiting Medicines:                Monitored Anesthesia Care Procedure:                Pre-Anesthesia Assessment:                           - Prior to the procedure, a History and Physical                            was performed, and patient medications and                            allergies were reviewed. The patient's tolerance of                            previous anesthesia was also reviewed. The risks                            and benefits of the procedure and the sedation                            options and risks were discussed with the patient.                            All questions were answered, and informed consent                            was obtained. Prior Anticoagulants: The patient has                            taken no previous anticoagulant or antiplatelet                            agents. ASA Grade Assessment: II - A patient with                            mild systemic disease. After reviewing the risks                            and benefits, the patient was deemed in                            satisfactory condition to undergo the procedure.  After obtaining informed consent, the endoscope was                            passed under direct vision. Throughout the                            procedure, the patient's blood pressure, pulse, and                            oxygen saturations were monitored continuously. The                            Endoscope was introduced through the mouth, and                            advanced to the second part of  duodenum. The upper                            GI endoscopy was accomplished without difficulty.                            The patient tolerated the procedure well. Scope In: Scope Out: Findings:                 Esophagogastric landmarks were identified: the                            Z-line was found at 39 cm, the gastroesophageal                            junction was found at 39 cm and the upper extent of                            the gastric folds was found at 42 cm from the                            incisors.                           A 3 cm hiatal hernia was present.                           The Z-line was slightly irregular but did not meet                            criteria for Barrett's.                           The exam of the esophagus was otherwise normal.                           The entire examined stomach was normal. Biopsies  were taken with a cold forceps for Helicobacter                            pylori testing.                           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                            Biopsies were taken with a cold forceps for                            histology.                           Diffuse mildly erythematous mucosa was found in the                            second portion of the duodenum. Biopsies for                            histology were taken with a cold forceps for                            evaluation of celiac disease.                           The exam of the duodenum was otherwise normal. Complications:            No immediate complications. Estimated blood loss:                            Minimal. Estimated Blood Loss:     Estimated blood loss was minimal. Impression:               - Esophagogastric landmarks identified.                           - 3 cm hiatal hernia.                           - Z-line slightly  irregular but did not meet                            criteria for Barrett's                           - Normal stomach. Biopsied.                           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 Biopsied.                           - Mild duodenal erythema. Biopsied.                           - Normal duodenum otherwise Recommendation:           - Patient has a contact number available for                            emergencies. The signs and symptoms of potential                            delayed complications were discussed with the                            patient. Return to normal activities tomorrow.                            Written discharge instructions were provided to the                            patient.                           - Resume previous diet.                           - Continue present medications.                           - Await pathology results.                           - Continue with trial of omeprazole 40mg  every day                            for 4 weeks (he has not started it set) Remo Lipps P. Chaunce Winkels, MD 05/22/2022 2:15:33 PM This report has been signed electronically.

## 2022-05-22 NOTE — Progress Notes (Signed)
History and Physical Interval Note: See clinic note on 5/26 for details. Symptoms of GERD, early satiety, vomiting. EGD to further evaluate. Have discussed risks / benefits he wants to proceed. No interval changes since I have last seen him.  05/22/2022 1:55 PM  Tommy Ortega  has presented today for endoscopic procedure(s), with the diagnosis of  Encounter Diagnoses  Name Primary?   Gastroesophageal reflux disease, unspecified whether esophagitis present Yes   Early satiety    Nausea and vomiting, unspecified vomiting type   .  The various methods of evaluation and treatment have been discussed with the patient and/or family. After consideration of risks, benefits and other options for treatment, the patient has consented to  the endoscopic procedure(s).   The patient's history has been reviewed, patient examined, no change in status, stable for surgery.  I have reviewed the patient's chart and labs.  Questions were answered to the patient's satisfaction.    Jolly Mango, MD [facility]

## 2022-05-23 ENCOUNTER — Telehealth: Payer: Self-pay

## 2022-05-23 ENCOUNTER — Telehealth: Payer: Self-pay | Admitting: *Deleted

## 2022-05-23 NOTE — Telephone Encounter (Signed)
Second attempt, left VM.  

## 2022-05-23 NOTE — Telephone Encounter (Signed)
Attempted f/u call. No answer, left VM. 

## 2023-01-17 ENCOUNTER — Ambulatory Visit (INDEPENDENT_AMBULATORY_CARE_PROVIDER_SITE_OTHER): Payer: 59 | Admitting: Family Medicine

## 2023-01-17 ENCOUNTER — Encounter: Payer: Self-pay | Admitting: Family Medicine

## 2023-01-17 VITALS — BP 128/70 | HR 74 | Temp 97.9°F | Ht 72.0 in | Wt 187.2 lb

## 2023-01-17 DIAGNOSIS — Z0000 Encounter for general adult medical examination without abnormal findings: Secondary | ICD-10-CM | POA: Diagnosis not present

## 2023-01-17 DIAGNOSIS — R0683 Snoring: Secondary | ICD-10-CM | POA: Diagnosis not present

## 2023-01-17 LAB — COMPREHENSIVE METABOLIC PANEL
ALT: 34 U/L (ref 0–53)
AST: 22 U/L (ref 0–37)
Albumin: 5.1 g/dL (ref 3.5–5.2)
Alkaline Phosphatase: 87 U/L (ref 39–117)
BUN: 10 mg/dL (ref 6–23)
CO2: 28 mEq/L (ref 19–32)
Calcium: 10 mg/dL (ref 8.4–10.5)
Chloride: 101 mEq/L (ref 96–112)
Creatinine, Ser: 1.08 mg/dL (ref 0.40–1.50)
GFR: 84.61 mL/min (ref >60.00)
Glucose, Bld: 96 mg/dL (ref 70–99)
Potassium: 4.5 mEq/L (ref 3.5–5.1)
Sodium: 137 mEq/L (ref 135–145)
Total Bilirubin: 0.6 mg/dL (ref 0.2–1.2)
Total Protein: 8 g/dL (ref 6.0–8.3)

## 2023-01-17 LAB — LIPID PANEL
Cholesterol: 268 mg/dL — ABNORMAL HIGH (ref 0–200)
HDL: 55.6 mg/dL (ref >39.00)
LDL Cholesterol: 183 mg/dL — ABNORMAL HIGH (ref 0–99)
NonHDL: 212.05
Total CHOL/HDL Ratio: 5
Triglycerides: 145 mg/dL (ref 0.0–149.0)
VLDL: 29 mg/dL (ref 0.0–40.0)

## 2023-01-17 LAB — CBC
HCT: 47.1 % (ref 39.0–52.0)
Hemoglobin: 15.8 g/dL (ref 13.0–17.0)
MCHC: 33.5 g/dL (ref 30.0–36.0)
MCV: 91.6 fl (ref 78.0–100.0)
Platelets: 248 10*3/uL (ref 150.0–400.0)
RBC: 5.14 Mil/uL (ref 4.22–5.81)
RDW: 13.3 % (ref 11.5–15.5)
WBC: 8.6 10*3/uL (ref 4.0–10.5)

## 2023-01-17 LAB — URINALYSIS, ROUTINE W REFLEX MICROSCOPIC
Bilirubin Urine: NEGATIVE
Hgb urine dipstick: NEGATIVE
Ketones, ur: NEGATIVE
Leukocytes,Ua: NEGATIVE
Nitrite: NEGATIVE
RBC / HPF: NONE SEEN (ref 0–?)
Specific Gravity, Urine: <=1.005 — AB (ref 1.000–1.030)
Total Protein, Urine: NEGATIVE
Urine Glucose: NEGATIVE
Urobilinogen, UA: 0.2 (ref 0.0–1.0)
pH: 6 (ref 5.0–8.0)

## 2023-01-17 NOTE — Progress Notes (Signed)
Established Patient Office Visit   Subjective:  Patient ID: ANCHOR DWAN, male    DOB: Apr 23, 1981  Age: 42 y.o. MRN: [id number]  Chief Complaint  Patient presents with   Annual Exam    CPE, referral for sleep study concerns about loud snoring. Patient fasting.     HPI Encounter Diagnoses  Name Primary?   Healthcare maintenance Yes   Snores    For physical exam.  He is doing well.  He has not essentially stopped drinking except for an occasional beer.  He stopped smoking but continues to vape some.  He is active on his job.  He has regular dental care.  He lives at home with his wife and children.  He snores and his wife has witnessed apneic episodes.  He is concerned.  Strong family history of heart disease in his father side and he is concerned.  Father had a massive MI in his late 85s.  Patient's lipid profile had a 10-year risk score 5.9%.   Review of Systems  Constitutional: Negative.   HENT: Negative.    Eyes:  Negative for blurred vision, discharge and redness.  Respiratory: Negative.  Negative for shortness of breath and wheezing.   Cardiovascular: Negative.  Negative for chest pain.  Gastrointestinal:  Negative for abdominal pain, blood in stool, constipation and melena.  Genitourinary: Negative.   Musculoskeletal: Negative.  Negative for myalgias.  Skin:  Negative for rash.  Neurological:  Negative for tingling, loss of consciousness and weakness.  Endo/Heme/Allergies:  Negative for polydipsia.      01/17/2023   10:14 AM 01/17/2023    9:27 AM 05/31/2021    8:46 AM  Depression screen PHQ 2/9  Decreased Interest 0 0 1  Down, Depressed, Hopeless 0 0 1  PHQ - 2 Score 0 0 2  Altered sleeping 2  2  Tired, decreased energy 1  1  Change in appetite 0  0  Feeling bad or failure about yourself  0  0  Trouble concentrating 0  0  Moving slowly or fidgety/restless 0  0  Suicidal thoughts 0  0  PHQ-9 Score 3  5  Difficult doing work/chores Not difficult at all   Somewhat difficult       Current Outpatient Medications:    omeprazole (PRILOSEC) 40 MG capsule, Take once daily 30 minutes before a meal, Disp: 30 capsule, Rfl: 3   Objective:     BP 128/70 (BP Location: Left Arm, Patient Position: Sitting, Cuff Size: Normal)   Pulse 74   Temp 97.9 F (36.6 C) (Temporal)   Ht 6' (1.829 m)   Wt 187 lb 3.2 oz (84.9 kg)   SpO2 98%   BMI 25.39 kg/m    Physical Exam Constitutional:      General: He is not in acute distress.    Appearance: Normal appearance. He is not ill-appearing, toxic-appearing or diaphoretic.  HENT:     Head: Normocephalic and atraumatic.     Right Ear: External ear normal.     Left Ear: External ear normal.     Mouth/Throat:     Mouth: Mucous membranes are moist.     Pharynx: Oropharynx is clear. No oropharyngeal exudate or posterior oropharyngeal erythema.  Eyes:     General: No scleral icterus.       Right eye: No discharge.        Left eye: No discharge.     Extraocular Movements: Extraocular movements intact.     Conjunctiva/sclera: Conjunctivae  normal.     Pupils: Pupils are equal, round, and reactive to light.  Cardiovascular:     Rate and Rhythm: Normal rate and regular rhythm.  Pulmonary:     Effort: Pulmonary effort is normal. No respiratory distress.     Breath sounds: Normal breath sounds.  Abdominal:     General: Bowel sounds are normal. There is no distension.     Tenderness: There is no abdominal tenderness. There is no guarding or rebound.     Hernia: No hernia is present. There is no hernia in the left inguinal area or right inguinal area.  Genitourinary:    Penis: Circumcised. No hypospadias, erythema, tenderness, discharge, swelling or lesions.      Testes:        Right: Mass, tenderness or swelling not present. Right testis is descended.        Left: Mass, tenderness or swelling not present. Left testis is descended.     Epididymis:     Right: Not inflamed or enlarged.     Left: Not  inflamed or enlarged.  Musculoskeletal:     Cervical back: No rigidity or tenderness.  Lymphadenopathy:     Lower Body: No right inguinal adenopathy. No left inguinal adenopathy.  Skin:    General: Skin is warm and dry.  Neurological:     Mental Status: He is alert and oriented to person, place, and time.  Psychiatric:        Mood and Affect: Mood normal.        Behavior: Behavior normal.      No results found for any visits on 01/17/23.    The 10-year ASCVD risk score (Arnett DK, et al., 2019) is: 5.6%    Assessment & Plan:   Healthcare maintenance -     CBC -     Comprehensive metabolic panel -     Lipid panel -     Urinalysis, Routine w reflex microscopic  Snores -     Ambulatory referral to Pulmonology    Return in about 1 year (around 01/18/2024).  Information was given on health maintenance and disease prevention.  Information was given on sleep apnea and preventing high cholesterol.  Encouraged him to continue his healthy and active lifestyle.  Agrees to go to pulmonology for evaluation of apnea.  Libby Maw, MD

## 2023-02-12 ENCOUNTER — Ambulatory Visit (INDEPENDENT_AMBULATORY_CARE_PROVIDER_SITE_OTHER): Payer: 59 | Admitting: Pulmonary Disease

## 2023-02-12 ENCOUNTER — Encounter: Payer: Self-pay | Admitting: Pulmonary Disease

## 2023-02-12 VITALS — BP 108/64 | HR 94 | Ht 72.0 in | Wt 182.4 lb

## 2023-02-12 DIAGNOSIS — R0683 Snoring: Secondary | ICD-10-CM

## 2023-02-12 NOTE — Patient Instructions (Signed)
I will see you back in about 3 months  We will schedule you for an in lab sleep study -This is the gold standard study  Optimize your sleep as best as possible   Living With Sleep Apnea Sleep apnea is a condition in which breathing pauses or becomes shallow during sleep. Sleep apnea is most commonly caused by a collapsed or blocked airway. People with sleep apnea usually snore loudly. They may have times when they gasp and stop breathing for 10 seconds or more during sleep. This may happen many times during the night. The breaks in breathing also interrupt the deep sleep that you need to feel rested. Even if you do not completely wake up from the gaps in breathing, your sleep may not be restful and you feel tired during the day. You may also have a headache in the morning and low energy during the day, and you may feel anxious or depressed. How can sleep apnea affect me? Sleep apnea increases your chances of extreme tiredness during the day (daytime fatigue). It can also increase your risk for health conditions, such as: Heart attack. Stroke. Obesity. Type 2 diabetes. Heart failure. Irregular heartbeat. High blood pressure. If you have daytime fatigue as a result of sleep apnea, you may be more likely to: Perform poorly at school or work. Fall asleep while driving. Have difficulty with attention. Develop depression or anxiety. Have sexual dysfunction. What actions can I take to manage sleep apnea? Sleep apnea treatment  If you were given a device to open your airway while you sleep, use it only as told by your health care provider. You may be given: An oral appliance. This is a custom-made mouthpiece that shifts your lower jaw forward. A continuous positive airway pressure (CPAP) device. This device blows air through a mask when you breathe out (exhale). A nasal expiratory positive airway pressure (EPAP) device. This device has valves that you put into each nostril. A bi-level  positive airway pressure (BIPAP) device. This device blows air through a mask when you breathe in (inhale) and breathe out (exhale). You may need surgery if other treatments do not work for you. Sleep habits Go to sleep and wake up at the same time every day. This helps set your internal clock (circadian rhythm) for sleeping. If you stay up later than usual, such as on weekends, try to get up in the morning within 2 hours of your normal wake time. Try to get at least 7-9 hours of sleep each night. Stop using a computer, tablet, and mobile phone a few hours before bedtime. Do not take long naps during the day. If you nap, limit it to 30 minutes. Have a relaxing bedtime routine. Reading or listening to music may relax you and help you sleep. Use your bedroom only for sleep. Keep your television and computer out of your bedroom. Keep your bedroom cool, dark, and quiet. Use a supportive mattress and pillows. Follow your health care provider's instructions for other changes to sleep habits. Nutrition Do not eat heavy meals in the evening. Do not have caffeine in the later part of the day. The effects of caffeine can last for more than 5 hours. Follow your health care provider's or dietitian's instructions for any diet changes. Lifestyle     Do not drink alcohol before bedtime. Alcohol can cause you to fall asleep at first, but then it can cause you to wake up in the middle of the night and have trouble getting back  to sleep. Do not use any products that contain nicotine or tobacco. These products include cigarettes, chewing tobacco, and vaping devices, such as e-cigarettes. If you need help quitting, ask your health care provider. Medicines Take over-the-counter and prescription medicines only as told by your health care provider. Do not use over-the-counter sleep medicine. You can become dependent on this medicine, and it can make sleep apnea worse. Do not use medicines, such as sedatives and  narcotics, unless told by your health care provider. Activity Exercise on most days, but avoid exercising in the evening. Exercising near bedtime can interfere with sleeping. If possible, spend time outside every day. Natural light helps regulate your circadian rhythm. General information Lose weight if you need to, and maintain a healthy weight. Keep all follow-up visits. This is important. If you are having surgery, make sure to tell your health care provider that you have sleep apnea. You may need to bring your device with you. Where to find more information Learn more about sleep apnea and daytime fatigue from: American Sleep Association: sleepassociation.[place]: sleepfoundation.org National Heart, Lung, and Blood Institute: https://www.hartman-hill.biz/ Summary Sleep apnea is a condition in which breathing pauses or becomes shallow during sleep. Sleep apnea can cause daytime fatigue and other serious health conditions. You may need to wear a device while sleeping to help keep your airway open. If you are having surgery, make sure to tell your health care provider that you have sleep apnea. You may need to bring your device with you. Making changes to sleep habits, diet, lifestyle, and activity can help you manage sleep apnea. This information is not intended to replace advice given to you by your health care provider. Make sure you discuss any questions you have with your health care provider. Document Revised: 07/13/2021 Document Reviewed: 11/12/2020 Elsevier Patient Education  [place].

## 2023-02-12 NOTE — Progress Notes (Signed)
Tommy Ortega    [id number]    02-26-1981  Primary Care Physician:Kremer, Mortimer Fries, MD  Referring Physician: Libby Maw, [address],  [place] [address]  Chief complaint:   Patient being seen for snoring, witnessed apneas  HPI:  Snoring for many years, witnessed apneas, gasping respirations at night Not very tired during the day but has been told by partner that he stops breathing frequently  Admits to dryness of his mouth in the morning No significant morning headaches  Usually goes to bed between 830 and 9 PM Falls asleep easily 2-3 awakenings Final wake up time about 5:30 AM  Symptoms are worse whenever he is laying on his back  Weight has been relatively stable  Dad did snore  He does have hypercholesterolemia  Smokes e-cigarettes  Drinks about 4 beers a day, was drinking heavier in the past  Did try an over-the-counter oral device-boil and bite device that did not work     Outpatient Encounter Medications as of 02/12/2023  Medication Sig   omeprazole (PRILOSEC) 40 MG capsule Take once daily 30 minutes before a meal   No facility-administered encounter medications on file as of 02/12/2023.    Allergies as of 02/12/2023   (No Known Allergies)    Past Medical History:  Diagnosis Date   Chicken pox    GERD (gastroesophageal reflux disease)     Past Surgical History:  Procedure Laterality Date   HAND SURGERY Left     Family History  Problem Relation Age of Onset   Cancer Mother    Heart disease Father    Arthritis Maternal Grandmother    Cancer Maternal Grandmother    Diabetes Maternal Grandmother    Heart attack Maternal Grandfather     Social History   Socioeconomic History   Marital status: Single    Spouse name: Not on file   Number of children: Not on file   Years of education: Not on file   Highest education level: Not on file  Occupational History   Not on file  Tobacco Use    Smoking status: Every Day    Types: E-cigarettes   Smokeless tobacco: Former    Types: Chew    Quit date: 2020  Vaping Use   Vaping Use: Every day  Substance and Sexual Activity   Alcohol use: Yes    Comment: rarely now    Drug use: Not Currently    Comment: ho heroin and opiate abuse, quit smoking marihuana this past June after his father's passing   Sexual activity: Yes    Partners: Female  Other Topics Concern   Not on file  Social History Narrative   Not on file   Social Determinants of Health   Financial Resource Strain: Not on file  Food Insecurity: Not on file  Transportation Needs: Not on file  Physical Activity: Not on file  Stress: Not on file  Social Connections: Not on file  Intimate Partner Violence: Not on file    Review of Systems  Psychiatric/Behavioral:  Positive for sleep disturbance.     Vitals:   02/12/23 1035  BP: 108/64  Pulse: 94  SpO2: 97%     Physical Exam Constitutional:      Appearance: Normal appearance.  HENT:     Head: Normocephalic.     Mouth/Throat:     Mouth: Mucous membranes are moist.  Eyes:     General: No scleral  icterus.    Pupils: Pupils are equal, round, and reactive to light.  Cardiovascular:     Rate and Rhythm: Normal rate and regular rhythm.     Heart sounds: No murmur heard.    No friction rub.  Pulmonary:     Effort: No respiratory distress.     Breath sounds: No stridor. No wheezing or rhonchi.  Musculoskeletal:     Cervical back: No rigidity or tenderness.  Neurological:     Mental Status: He is alert.  Psychiatric:        Mood and Affect: Mood normal.       02/12/2023   10:00 AM  Results of the Epworth flowsheet  Sitting and reading 2  Watching TV 2  Sitting, inactive in a public place (e.g. a theatre or a meeting) 0  As a passenger in a car for an hour without a break 0  Lying down to rest in the afternoon when circumstances permit 1  Sitting and talking to someone 0  Sitting quietly after a  lunch without alcohol 0  In a car, while stopped for a few minutes in traffic 0  Total score 5     Data Reviewed: No previous sleep study on record  Assessment:  Low to moderate probability of significant obstructive sleep apnea  Though he has been observed with apneic episodes, does not have a lot of daytime symptoms at present  Pathophysiology of sleep disordered breathing was discussed with the patient  Treatment options discussed with the patient  Plan/Recommendations:  Schedule the patient for an in lab sleep study  Can be scheduled as a split-night study  Encourage weight loss efforts  Encourage decrease alcohol ingestion  Tentative follow-up in about 3 to 4 months   Sherrilyn Rist MD [facility] 02/12/2023, 10:51 AM  CC: Libby Maw,*

## 2023-05-02 ENCOUNTER — Telehealth: Payer: Self-pay | Admitting: Pulmonary Disease

## 2023-05-02 NOTE — Telephone Encounter (Signed)
No pa req for HST, just needing order

## 2023-05-02 NOTE — Telephone Encounter (Signed)
Authorization for this pt In lab sleep study has been denied by [facility]. P2P is being requested at phone number [phone] P2P can be completed within 21 days from denial. Patient may be able to complete a HST, Please let me know if you'd like to move forward with P2P    CASE ID: [id number]

## 2023-05-02 NOTE — Telephone Encounter (Signed)
Can schedule for a home sleep test

## 2023-05-07 ENCOUNTER — Other Ambulatory Visit: Payer: Self-pay | Admitting: Pulmonary Disease

## 2023-05-07 DIAGNOSIS — R0681 Apnea, not elsewhere classified: Secondary | ICD-10-CM

## 2023-05-08 ENCOUNTER — Other Ambulatory Visit: Payer: Self-pay

## 2023-05-08 NOTE — Telephone Encounter (Signed)
HST order has been placed 

## 2024-07-04 ENCOUNTER — Encounter: Payer: Self-pay | Admitting: Advanced Practice Midwife
